# Patient Record
Sex: Female | Born: 1970
Health system: Southern US, Community
[De-identification: ages and names within clinical notes are randomized; demographics above are authoritative.]

## PROBLEM LIST (undated history)

## (undated) DIAGNOSIS — K219 Gastro-esophageal reflux disease without esophagitis: Secondary | ICD-10-CM

## (undated) DIAGNOSIS — I10 Essential (primary) hypertension: Secondary | ICD-10-CM

## (undated) HISTORY — DX: Gastro-esophageal reflux disease without esophagitis: K21.9

## (undated) HISTORY — DX: Essential (primary) hypertension: I10

---

## 2006-05-14 HISTORY — PX: ABDOMINAL HYSTERECTOMY: SHX81

## 2011-06-12 ENCOUNTER — Inpatient Hospital Stay (INDEPENDENT_AMBULATORY_CARE_PROVIDER_SITE_OTHER)
Admission: RE | Admit: 2011-06-12 | Discharge: 2011-06-12 | Disposition: A | Discharge status: Home / Self Care | Payer: BC Managed Care – PPO | Source: Ambulatory Visit | Attending: Emergency Medicine | Admitting: Emergency Medicine

## 2011-06-12 DIAGNOSIS — R197 Diarrhea, unspecified: Secondary | ICD-10-CM

## 2011-08-07 ENCOUNTER — Other Ambulatory Visit: Payer: Self-pay | Admitting: *Deleted

## 2011-08-07 DIAGNOSIS — Z1231 Encounter for screening mammogram for malignant neoplasm of breast: Secondary | ICD-10-CM

## 2011-08-17 ENCOUNTER — Emergency Department (HOSPITAL_COMMUNITY)
Admission: EM | Admit: 2011-08-17 | Discharge: 2011-08-18 | Disposition: A | Discharge status: Home / Self Care | Payer: BC Managed Care – PPO | Attending: Emergency Medicine | Admitting: Emergency Medicine

## 2011-08-17 DIAGNOSIS — M542 Cervicalgia: Secondary | ICD-10-CM | POA: Insufficient documentation

## 2011-08-17 DIAGNOSIS — I10 Essential (primary) hypertension: Secondary | ICD-10-CM | POA: Insufficient documentation

## 2011-08-17 DIAGNOSIS — M436 Torticollis: Secondary | ICD-10-CM | POA: Insufficient documentation

## 2011-08-17 HISTORY — DX: Essential (primary) hypertension: I10

## 2011-08-18 ENCOUNTER — Encounter: Payer: Self-pay | Admitting: *Deleted

## 2011-08-18 MED ORDER — DIAZEPAM 5 MG PO TABS: 5.0000 mg | ORAL_TABLET | Freq: Two times a day (BID) | ORAL | Status: AC

## 2011-08-18 MED ORDER — IBUPROFEN 200 MG PO TABS
600.0000 mg | ORAL_TABLET | Freq: Once | ORAL | Status: AC
  Administered 2011-08-18: 600 mg via ORAL
  Filled 2011-08-18: qty 3

## 2011-08-18 NOTE — ED Notes (Signed)
The pt is c/o a stiff neck for 4 days .  No known injury

## 2011-08-18 NOTE — ED Provider Notes (Addendum)
History     CSN: [id number] Arrival date & time: 08/17/2011 11:51 PM   First MD Initiated Contact with Patient 08/18/11 0140      Chief Complaint  Patient presents with  . Torticollis    (Consider location/radiation/quality/duration/timing/severity/associated sxs/prior treatment) HPI Comments: 4 days of L neck pain and stiffness ice injury.  States woke up one morning and neck was stiff to grip progressively gotten worse since.  Has tried over-the-counter Tylenol, Motrin, ice, heat, without relief  The history is provided by the patient.    Past Medical History  Diagnosis Date  . Hypertension     History reviewed. No pertinent past surgical history.  History reviewed. No pertinent family history.  History  Substance Use Topics  . Smoking status: Never Smoker   . Smokeless tobacco: Not on file  . Alcohol Use: No    OB History    Grav Para Term Preterm Abortions TAB SAB Ect Mult Living                  Review of Systems  Constitutional: Negative.   HENT: Positive for neck pain. Negative for ear pain, facial swelling, rhinorrhea, neck stiffness, tinnitus and ear discharge.   Eyes: Negative.   Cardiovascular: Negative.   Gastrointestinal: Negative.   Genitourinary: Negative.   Neurological: Negative.   Hematological: Negative.   Psychiatric/Behavioral: Negative.     Allergies  Diovan hct  Home Medications   Current Outpatient Rx  Name Route Sig Dispense Refill  . CARVEDILOL 25 MG PO TABS Oral Take 12.5-25 mg by mouth 2 (two) times daily with a meal. Takes 2 tablets in the morning and 1 tablet in the evening     . TYLENOL PM EXTRA STRENGTH PO Oral Take 2 tablets by mouth at bedtime as needed. For pain     . HYDROCHLOROTHIAZIDE 12.5 MG PO CAPS Oral Take 12.5 mg by mouth daily.      . ANALGESIC BALM 15-15 % EX OINT Topical Apply 1 application topically daily as needed. Muscle pain     . VITAMIN D (CHOLECALCIFEROL) PO Oral Take 1 tablet by mouth daily.         BP 141/76  Pulse 81  Temp 98.1 F (36.7 C)  Resp 19  SpO2 100%  Physical Exam  Nursing note and vitals reviewed. Constitutional: She is oriented to person, place, and time. She appears well-developed and well-nourished.  HENT:  Head: Atraumatic.  Eyes: EOM are normal.  Neck: No JVD present. No tracheal deviation present. No thyromegaly present.    Cardiovascular: Regular rhythm.   Pulmonary/Chest: Breath sounds normal.  Abdominal: Soft.  Lymphadenopathy:    She has no cervical adenopathy.  Neurological: She is oriented to person, place, and time.  Skin: Skin is warm and dry.  Psychiatric: She has a normal mood and affect.    ED Course  Procedures (including critical care time)  Labs Reviewed - No data to display No results found.   1. Torticollis       MDM  Muscle strain, meningitis,         Arman Filter, NP 08/18/11 [id number]  Arman Filter, NP 08/22/11 1191  Arman Filter, NP 09/09/11 0434  Arman Filter, NP 09/09/11 [phone]

## 2011-08-18 NOTE — ED Notes (Signed)
Pt c/o neck feeling stiff for approx. 4 days, no known injury.

## 2011-08-22 NOTE — ED Provider Notes (Signed)
Medical screening examination/treatment/procedure(s) were performed by non-physician practitioner and as supervising physician I was immediately available for consultation/collaboration.   Vida Roller, MD 08/22/11 [phone]

## 2011-09-09 NOTE — ED Provider Notes (Signed)
Medical screening examination/treatment/procedure(s) were performed by non-physician practitioner and as supervising physician I was immediately available for consultation/collaboration.   Vida Roller, MD 09/09/11 [phone]

## 2011-09-10 ENCOUNTER — Ambulatory Visit: Payer: BC Managed Care – PPO

## 2011-12-19 ENCOUNTER — Encounter: Payer: Self-pay | Admitting: Internal Medicine

## 2011-12-19 DIAGNOSIS — I10 Essential (primary) hypertension: Secondary | ICD-10-CM | POA: Insufficient documentation

## 2011-12-19 HISTORY — DX: Essential (primary) hypertension: I10

## 2011-12-24 ENCOUNTER — Ambulatory Visit: Payer: BC Managed Care – PPO | Admitting: Internal Medicine

## 2012-01-06 ENCOUNTER — Emergency Department (HOSPITAL_COMMUNITY)
Admission: EM | Admit: 2012-01-06 | Discharge: 2012-01-06 | Disposition: A | Discharge status: Home / Self Care | Payer: BC Managed Care – PPO | Attending: Emergency Medicine | Admitting: Emergency Medicine

## 2012-01-06 ENCOUNTER — Encounter (HOSPITAL_COMMUNITY): Payer: Self-pay | Admitting: *Deleted

## 2012-01-06 DIAGNOSIS — I10 Essential (primary) hypertension: Secondary | ICD-10-CM | POA: Insufficient documentation

## 2012-01-06 DIAGNOSIS — R319 Hematuria, unspecified: Secondary | ICD-10-CM | POA: Insufficient documentation

## 2012-01-06 DIAGNOSIS — N390 Urinary tract infection, site not specified: Secondary | ICD-10-CM

## 2012-01-06 LAB — URINALYSIS, ROUTINE W REFLEX MICROSCOPIC
Bilirubin Urine: NEGATIVE
Leukocytes, UA: NEGATIVE
Nitrite: NEGATIVE
Specific Gravity, Urine: 1.026 (ref 1.005–1.030)
pH: 6 (ref 5.0–8.0)

## 2012-01-06 LAB — URINE MICROSCOPIC-ADD ON

## 2012-01-06 MED ORDER — NITROFURANTOIN MONOHYD MACRO 100 MG PO CAPS: 100.0000 mg | ORAL_CAPSULE | Freq: Two times a day (BID) | ORAL | Status: AC

## 2012-01-06 MED ORDER — NITROFURANTOIN MONOHYD MACRO 100 MG PO CAPS
100.0000 mg | ORAL_CAPSULE | ORAL | Status: AC
  Administered 2012-01-06: 100 mg via ORAL
  Filled 2012-01-06: qty 1

## 2012-01-06 NOTE — ED Notes (Signed)
Discharged with written and verbal instructions.

## 2012-01-06 NOTE — Discharge Instructions (Signed)
Urinary Tract Infection A urinary tract infection (UTI) is often caused by a germ (bacteria). A UTI is usually helped with medicine (antibiotics) that kills germs. Take all the medicine until it is gone. Do this even if you are feeling better. You are usually better in 7 to 10 days. HOME CARE   Drink enough water and fluids to keep your pee (urine) clear or pale yellow. Drink:   Cranberry juice.   Water.   Avoid:   Caffeine.   Tea.   Bubbly (carbonated) drinks.   Alcohol.   Only take medicine as told by your doctor.   To prevent further infections:   Pee often.   After pooping (bowel movement), women should wipe from front to back. Use each tissue only once.   Pee before and after having sex (intercourse).  Ask your doctor when your test results will be ready. Make sure you follow up and get your test results.  GET HELP RIGHT AWAY IF:   There is very bad back pain or lower belly (abdominal) pain.   You get the chills.   You have a fever.   Your baby is older than 3 months with a rectal temperature of 102 F (38.9 C) or higher.   Your baby is 3 months old or younger with a rectal temperature of 100.4 F (38 C) or higher.   You feel sick to your stomach (nauseous) or throw up (vomit).   There is continued burning with peeing.   Your problems are not better in 3 days. Return sooner if you are getting worse.  MAKE SURE YOU:   Understand these instructions.   Will watch your condition.   Will get help right away if you are not doing well or get worse.  Document Released: 03/02/2008 Document Revised: 09/03/2011 Document Reviewed: 03/02/2008 [employer] Patient Information 2012 [employer]. 

## 2012-01-06 NOTE — ED Notes (Signed)
Med not given - sent to pharmacy.  Medications checked and returned from pharmacy for administration

## 2012-01-06 NOTE — ED Provider Notes (Signed)
History     CSN: [id number]  Arrival date & time 01/06/12  1958   First MD Initiated Contact with Patient 01/06/12 2155      Chief Complaint  Patient presents with  . Hematuria    (Consider location/radiation/quality/duration/timing/severity/associated sxs/prior treatment) Patient is a 41 y.o. female presenting with hematuria. The history is provided by the patient.  Hematuria This is a new problem. Episode onset: 2 days ago. The problem has been gradually worsening since onset. She describes the hematuria as gross hematuria. The hematuria occurs throughout her entire urinary stream. She reports no clotting in her urine stream. Her pain is at a severity of 4/10. The pain is moderate. She describes her urine color as tea colored. Irritative symptoms include frequency and urgency. Associated symptoms include chills, dysuria, flank pain and nausea. Pertinent negatives include no abdominal pain, fever or vomiting. She is sexually active. There is no history of kidney stones, recent infection or STDs.    Past Medical History  Diagnosis Date  . Hypertension   . HTN (hypertension) 12/19/2011    History reviewed. No pertinent past surgical history.  History reviewed. No pertinent family history.  History  Substance Use Topics  . Smoking status: Never Smoker   . Smokeless tobacco: Not on file  . Alcohol Use: No    OB History    Grav Para Term Preterm Abortions TAB SAB Ect Mult Living                  Review of Systems  Constitutional: Positive for chills. Negative for fever.  Gastrointestinal: Positive for nausea. Negative for vomiting and abdominal pain.  Genitourinary: Positive for dysuria, urgency, frequency, hematuria and flank pain.  All other systems reviewed and are negative.    Allergies  Diovan hct  Home Medications   Current Outpatient Rx  Name Route Sig Dispense Refill  . CARVEDILOL 25 MG PO TABS Oral Take 12.5-25 mg by mouth 2 (two) times daily with a meal.  Takes 2 tablets in the morning and 1 tablet in the evening     . HYDROCHLOROTHIAZIDE 12.5 MG PO CAPS Oral Take 12.5 mg by mouth daily.        BP 161/87  Pulse 74  Temp(Src) 98.6 F (37 C) (Oral)  Resp 20  SpO2 97%  Physical Exam  Nursing note and vitals reviewed. Constitutional: She is oriented to person, place, and time. She appears well-developed and well-nourished. No distress.  HENT:  Head: Normocephalic and atraumatic.  Eyes: EOM are normal. Pupils are equal, round, and reactive to light.  Cardiovascular: Normal rate, regular rhythm, normal heart sounds and intact distal pulses.  Exam reveals no friction rub.   No murmur heard. Pulmonary/Chest: Effort normal and breath sounds normal. She has no wheezes. She has no rales.  Abdominal: Soft. Bowel sounds are normal. She exhibits no distension. There is tenderness in the suprapubic area. There is no rebound, no guarding and no CVA tenderness.  Musculoskeletal: Normal range of motion. She exhibits no tenderness.       No edema  Neurological: She is alert and oriented to person, place, and time. No cranial nerve deficit.  Skin: Skin is warm and dry. No rash noted.  Psychiatric: She has a normal mood and affect. Her behavior is normal.    ED Course  Procedures (including critical care time)  Labs Reviewed  URINALYSIS, ROUTINE W REFLEX MICROSCOPIC - Abnormal; Notable for the following:    APPearance CLOUDY (*)  Hgb urine dipstick TRACE (*)    All other components within normal limits  URINE MICROSCOPIC-ADD ON - Abnormal; Notable for the following:    Squamous Epithelial / LPF MANY (*)    Bacteria, UA FEW (*)    All other components within normal limits   No results found.   No diagnosis found.    MDM   Patient with symptoms concerning for UTI. She complains of frequency, urgency, flank pain, nausea without vomiting or fever. She states that her symptoms started yesterday and only worsened. She denies any risk factors  for STD and states she has been actually active with one partner who is her husband for years and is not concerned. She denies any vaginal discharge. She has no prior history of kidney stones and her history today is not suggestive of that. UA shows few bacteria and red blood cells. Given her symptoms we'll treat with antibiotics and have her followup with her doctor if symptoms worsen.        Gwyneth Sprout, MD 01/06/12 2229

## 2012-01-06 NOTE — ED Notes (Signed)
Pt reports blood in urine and bilateral lower back pain since yesterday.  Pt reports frequency, urgency.  Denies burning with urination.

## 2012-05-11 ENCOUNTER — Ambulatory Visit: Payer: BC Managed Care – PPO | Admitting: Internal Medicine

## 2012-05-11 DIAGNOSIS — Z0289 Encounter for other administrative examinations: Secondary | ICD-10-CM

## 2012-09-26 ENCOUNTER — Emergency Department (HOSPITAL_COMMUNITY)
Admission: EM | Admit: 2012-09-26 | Discharge: 2012-09-26 | Disposition: A | Discharge status: Home / Self Care | Payer: Self-pay

## 2012-09-28 ENCOUNTER — Encounter (HOSPITAL_COMMUNITY): Payer: Self-pay | Admitting: Emergency Medicine

## 2012-09-28 ENCOUNTER — Emergency Department (HOSPITAL_COMMUNITY)
Admission: EM | Admit: 2012-09-28 | Discharge: 2012-09-28 | Disposition: A | Discharge status: Home / Self Care | Payer: BC Managed Care – PPO | Attending: Emergency Medicine | Admitting: Emergency Medicine

## 2012-09-28 ENCOUNTER — Emergency Department (HOSPITAL_COMMUNITY): Payer: BC Managed Care – PPO

## 2012-09-28 DIAGNOSIS — N390 Urinary tract infection, site not specified: Secondary | ICD-10-CM

## 2012-09-28 DIAGNOSIS — Z91199 Patient's noncompliance with other medical treatment and regimen due to unspecified reason: Secondary | ICD-10-CM | POA: Insufficient documentation

## 2012-09-28 DIAGNOSIS — Z9119 Patient's noncompliance with other medical treatment and regimen: Secondary | ICD-10-CM | POA: Insufficient documentation

## 2012-09-28 DIAGNOSIS — R079 Chest pain, unspecified: Secondary | ICD-10-CM

## 2012-09-28 DIAGNOSIS — I10 Essential (primary) hypertension: Secondary | ICD-10-CM

## 2012-09-28 DIAGNOSIS — Z3202 Encounter for pregnancy test, result negative: Secondary | ICD-10-CM | POA: Insufficient documentation

## 2012-09-28 DIAGNOSIS — R51 Headache: Secondary | ICD-10-CM | POA: Insufficient documentation

## 2012-09-28 LAB — URINALYSIS, ROUTINE W REFLEX MICROSCOPIC
Bilirubin Urine: NEGATIVE
Hgb urine dipstick: NEGATIVE
Protein, ur: NEGATIVE mg/dL
Urobilinogen, UA: 0.2 mg/dL (ref 0.0–1.0)

## 2012-09-28 LAB — BASIC METABOLIC PANEL
BUN: 6 mg/dL (ref 6–23)
Calcium: 9.3 mg/dL (ref 8.4–10.5)
GFR calc non Af Amer: >90 mL/min (ref >90)
Glucose, Bld: 90 mg/dL (ref 70–99)
Sodium: 134 mEq/L — ABNORMAL LOW (ref 135–145)

## 2012-09-28 LAB — URINE MICROSCOPIC-ADD ON

## 2012-09-28 LAB — POCT I-STAT TROPONIN I: Troponin i, poc: 0 ng/mL (ref 0.00–0.08)

## 2012-09-28 LAB — CBC
MCH: 26.6 pg (ref 26.0–34.0)
MCHC: 33.1 g/dL (ref 30.0–36.0)
Platelets: 393 10*3/uL (ref 150–400)
RDW: 12.8 % (ref 11.5–15.5)

## 2012-09-28 MED ORDER — CIPROFLOXACIN HCL 500 MG PO TABS: 500.0000 mg | ORAL_TABLET | Freq: Two times a day (BID) | ORAL | Status: DC

## 2012-09-28 MED ORDER — CIPROFLOXACIN HCL 500 MG PO TABS: 500.0000 mg | ORAL_TABLET | Freq: Once | ORAL | Status: DC

## 2012-09-28 MED ORDER — HYDROCHLOROTHIAZIDE 25 MG PO TABS: 25.0000 mg | ORAL_TABLET | Freq: Every day | ORAL | Status: DC

## 2012-09-28 MED ORDER — CIPROFLOXACIN HCL 500 MG PO TABS
ORAL_TABLET | ORAL | Status: AC
  Filled 2012-09-28: qty 1

## 2012-09-28 MED ORDER — CARVEDILOL 25 MG PO TABS: 25.0000 mg | ORAL_TABLET | Freq: Two times a day (BID) | ORAL | Status: DC

## 2012-09-28 MED ORDER — ASPIRIN 325 MG PO TABS
325.0000 mg | ORAL_TABLET | Freq: Once | ORAL | Status: AC
  Administered 2012-09-28: 325 mg via ORAL
  Filled 2012-09-28: qty 1

## 2012-09-28 MED ORDER — HYDROCHLOROTHIAZIDE 12.5 MG PO CAPS
12.5000 mg | ORAL_CAPSULE | Freq: Once | ORAL | Status: AC
Start: 2012-09-28 — End: 2012-09-28
  Administered 2012-09-28: 12.5 mg via ORAL
  Filled 2012-09-28: qty 1

## 2012-09-28 MED ORDER — CARVEDILOL 25 MG PO TABS
25.0000 mg | ORAL_TABLET | Freq: Once | ORAL | Status: AC
  Administered 2012-09-28: 25 mg via ORAL
  Filled 2012-09-28: qty 1

## 2012-09-28 NOTE — ED Notes (Signed)
Pt waiting on last lab result, in no acute distress

## 2012-09-28 NOTE — ED Notes (Signed)
Pt states that her blood pressure has been elevated and she's been out of her meds, she complains of central chest pain, dizziness, lightheadedness, and arm numbness for two days

## 2012-09-28 NOTE — ED Provider Notes (Signed)
History     CSN: [id number]  Arrival date & time 09/28/12  1413   First MD Initiated Contact with Patient 09/28/12 1510      Chief Complaint  Patient presents with  . Chest Pain    (Consider location/radiation/quality/duration/timing/severity/associated sxs/prior treatment) The history is provided by the patient.  Loretta Greene is a 42 y.o. female history of hypertension with medication noncompliance here with chest pain and headaches. She has not been taking her hydrochlorothiazide and Coreg for the last 3 months. In the last 3 days she's been having heaviness in her substernal chest area. It's constant and without any radiation. No nausea vomiting or abdominal pain and diarrhea. No history of CAD or stents. She also has intermittent headaches that is improving with Aleve. She measured her BP at home and was 180/90.    Past Medical History  Diagnosis Date  . Hypertension   . HTN (hypertension) 12/19/2011    History reviewed. No pertinent past surgical history.  History reviewed. No pertinent family history.  History  Substance Use Topics  . Smoking status: Never Smoker   . Smokeless tobacco: Not on file  . Alcohol Use: No    OB History    Grav Para Term Preterm Abortions TAB SAB Ect Mult Living                  Review of Systems  Cardiovascular: Positive for chest pain.  Neurological: Positive for headaches.  All other systems reviewed and are negative.    Allergies  Diovan hct  Home Medications   Current Outpatient Rx  Name  Route  Sig  Dispense  Refill  . NAPROXEN SODIUM 220 MG PO TABS   Oral   Take 220 mg by mouth 2 (two) times daily with a meal. PAIN           BP 135/81  Pulse 73  Temp 98.1 F (36.7 C) (Oral)  Resp 15  SpO2 100%  Physical Exam  Nursing note and vitals reviewed. Constitutional: She is oriented to person, place, and time. She appears well-developed and well-nourished. No distress.  HENT:  Head: Normocephalic.  Mouth/Throat:  Oropharynx is clear and moist.  Eyes: Conjunctivae normal are normal. Pupils are equal, round, and reactive to light.  Neck: Normal range of motion. Neck supple.  Cardiovascular: Normal rate, regular rhythm and normal heart sounds.   Pulmonary/Chest: Effort normal and breath sounds normal. No respiratory distress. She has no wheezes. She has no rales.  Abdominal: Soft. Bowel sounds are normal. She exhibits no distension. There is no tenderness. There is no rebound.  Musculoskeletal: Normal range of motion. She exhibits no edema and no tenderness.  Neurological: She is alert and oriented to person, place, and time. No cranial nerve deficit. Coordination normal.  Skin: Skin is warm and dry. She is not diaphoretic.  Psychiatric: She has a normal mood and affect. Her behavior is normal. Judgment and thought content normal.    ED Course  Procedures (including critical care time)  Labs Reviewed  BASIC METABOLIC PANEL - Abnormal; Notable for the following:    Sodium 134 (*)     All other components within normal limits  CBC - Abnormal; Notable for the following:    Hemoglobin 11.5 (*)     HCT 34.7 (*)     All other components within normal limits  URINALYSIS, ROUTINE W REFLEX MICROSCOPIC - Abnormal; Notable for the following:    Leukocytes, UA TRACE (*)  All other components within normal limits  POCT I-STAT TROPONIN I  PREGNANCY, URINE  URINE MICROSCOPIC-ADD ON  TROPONIN I   Dg Chest 2 View  09/28/2012  *RADIOLOGY REPORT*  Clinical Data: Chest pains  CHEST - 2 VIEW  Comparison: None  Findings: The heart size and mediastinal contours are within normal limits.  Both lungs are clear.  The visualized skeletal structures are unremarkable.  IMPRESSION: No active cardiopulmonary abnormalities.   Original Report Authenticated By: Signa Kell, M.D.      No diagnosis found.   Date: 09/28/2012  Rate: 73  Rhythm: normal sinus rhythm  QRS Axis: normal  Intervals: normal  ST/T Wave  abnormalities: normal  Conduction Disutrbances:none  Narrative Interpretation:   Old EKG Reviewed: none available     MDM  Loretta Greene is a 42 y.o. female here with HTN, chest pain, HA. Symptoms likely from uncontrolled hypertension. She has nonfocal neuro exam. Will get trop x 2 , labs, CXR. Will give her PO BP meds and reassess.   6:59 PM Trop neg x 2. UA +leuks, given cipro. BP normalized after PO meds. Will d/c home with hctz, coreg, cipro.        Richardean Canal, MD 09/28/12 1900

## 2012-09-28 NOTE — ED Notes (Signed)
Pt reports heaviness in chest for several days, denies SOB, nausea, radiation of pain or additional sx.

## 2012-10-15 ENCOUNTER — Emergency Department (HOSPITAL_COMMUNITY)
Admission: EM | Admit: 2012-10-15 | Discharge: 2012-10-15 | Disposition: A | Discharge status: Home / Self Care | Payer: BC Managed Care – PPO | Attending: Emergency Medicine | Admitting: Emergency Medicine

## 2012-10-15 ENCOUNTER — Encounter (HOSPITAL_COMMUNITY): Payer: Self-pay | Admitting: Emergency Medicine

## 2012-10-15 DIAGNOSIS — R197 Diarrhea, unspecified: Secondary | ICD-10-CM

## 2012-10-15 DIAGNOSIS — Z79899 Other long term (current) drug therapy: Secondary | ICD-10-CM | POA: Insufficient documentation

## 2012-10-15 DIAGNOSIS — I10 Essential (primary) hypertension: Secondary | ICD-10-CM | POA: Insufficient documentation

## 2012-10-15 NOTE — ED Notes (Signed)
Patient states that she had chinese food last night and since has no tbeen able to eat. The patient reports that she is having frequently loose stools. She is drinking "but it is going right through me"

## 2012-10-15 NOTE — ED Provider Notes (Signed)
History  Scribed for [name], PA-C/ Juliet Rude. Pickering, MD, the patient was seen in room WTR9/WTR9. This chart was scribed by Candelaria Stagers. The patient's care started at 7:39 PM   CSN: [id number]  Arrival date & time 10/15/12  1722   First MD Initiated Contact with Patient 10/15/12 1928      Chief Complaint  Patient presents with  . Diarrhea     The history is provided by the patient. No language interpreter was used.   Loretta Greene is a 42 y.o. female who presents to the Emergency Department complaining of diarrhea that started yesterday.  Pt ate chinese food yesterday and think the diarrhea is from this.  Pt is also experiencing diaphoresis right before diarrhea.  She denies abdominal pain or cramping, or fever.  She reports her last episode of diarrhea was about four hours ago.  Nothing seem to make the sx better or worse.  Pt reports she has the next three days off work.  Pt does not currently have a PCP.   Past Medical History  Diagnosis Date  . Hypertension   . HTN (hypertension) 12/19/2011    Past Surgical History  Procedure Date  . Abdominal hysterectomy     History reviewed. No pertinent family history.  History  Substance Use Topics  . Smoking status: Never Smoker   . Smokeless tobacco: Not on file  . Alcohol Use: No    OB History    Grav Para Term Preterm Abortions TAB SAB Ect Mult Living                  Review of Systems  Constitutional: Negative for fever, chills, diaphoresis and fatigue.  Respiratory: Negative for shortness of breath.   Gastrointestinal: Positive for diarrhea. Negative for nausea, vomiting and abdominal pain.  Neurological: Negative for weakness.  All other systems reviewed and are negative.    Allergies  Diovan hct  Home Medications   Current Outpatient Rx  Name  Route  Sig  Dispense  Refill  . CARVEDILOL 25 MG PO TABS   Oral   Take 1 tablet (25 mg total) by mouth 2 (two) times daily with a meal.   30 tablet   0   . HYDROCHLOROTHIAZIDE 25 MG PO TABS   Oral   Take 1 tablet (25 mg total) by mouth daily.   30 tablet   0   . NAPROXEN SODIUM 220 MG PO TABS   Oral   Take 220 mg by mouth 2 (two) times daily as needed. PAIN           BP 142/84  Pulse 73  Temp 98.6 F (37 C) (Oral)  Resp 18  SpO2 100%  Physical Exam  Nursing note and vitals reviewed. Constitutional: She is oriented to person, place, and time. She appears well-developed and well-nourished. No distress.  HENT:  Head: Normocephalic and atraumatic.  Eyes: EOM are normal.  Neck: Neck supple. No tracheal deviation present.  Cardiovascular: Normal rate.   Pulmonary/Chest: Effort normal. No respiratory distress.  Abdominal: Soft. Bowel sounds are normal. She exhibits no distension. There is no tenderness. There is no guarding.  Musculoskeletal: Normal range of motion.  Neurological: She is alert and oriented to person, place, and time.  Skin: Skin is warm and dry.  Psychiatric: She has a normal mood and affect. Her behavior is normal.    ED Course  Procedures   DIAGNOSTIC STUDIES: Oxygen Saturation is 100% on room air, normal by my  interpretation.    COORDINATION OF CARE:  7:42 PM  Advised pt to continue drinking fluids over the next few days.  Discussed with pt what sx should bring her back to the ED including fever, abdominal pain, or persistent diarrhea for more than three days.  Pt understands and agrees.  Pt decline blood work.    Labs Reviewed - No data to display No results found.   1. Diarrhea       MDM  Pt has been advised of the symptoms that warrant their return to the ED. Patient has voiced understanding and has agreed to follow-up with the PCP or specialist.  I personally performed the services described in this documentation, which was scribed in my presence. The recorded information has been reviewed and is accurate.        Dorthula Matas, PA 10/15/12 2223

## 2012-10-15 NOTE — ED Provider Notes (Signed)
Medical screening examination/treatment/procedure(s) were performed by non-physician practitioner and as supervising physician I was immediately available for consultation/collaboration.  Kyah Buesing R. Shakur Lembo, MD 10/15/12 2314 

## 2012-11-22 ENCOUNTER — Ambulatory Visit (INDEPENDENT_AMBULATORY_CARE_PROVIDER_SITE_OTHER): Payer: BC Managed Care – PPO | Admitting: Physician Assistant

## 2012-11-22 ENCOUNTER — Encounter: Payer: Self-pay | Admitting: Physician Assistant

## 2012-11-22 VITALS — BP 160/92 | HR 65 | Temp 98.2°F | Resp 16 | Ht 60.0 in | Wt 169.0 lb

## 2012-11-22 DIAGNOSIS — I10 Essential (primary) hypertension: Secondary | ICD-10-CM

## 2012-11-22 DIAGNOSIS — Z23 Encounter for immunization: Secondary | ICD-10-CM

## 2012-11-22 DIAGNOSIS — K219 Gastro-esophageal reflux disease without esophagitis: Secondary | ICD-10-CM

## 2012-11-22 MED ORDER — CARVEDILOL 25 MG PO TABS: 25.0000 mg | ORAL_TABLET | Freq: Two times a day (BID) | ORAL | Status: DC

## 2012-11-22 MED ORDER — HYDROCHLOROTHIAZIDE 25 MG PO TABS: 25.0000 mg | ORAL_TABLET | Freq: Every day | ORAL | Status: DC

## 2012-11-22 MED ORDER — OMEPRAZOLE 20 MG PO CPDR: 20.0000 mg | DELAYED_RELEASE_CAPSULE | Freq: Every day | ORAL | Status: DC

## 2012-11-22 MED ORDER — CARVEDILOL 25 MG PO TABS
25.0000 mg | ORAL_TABLET | Freq: Two times a day (BID) | ORAL | Status: DC
Start: 2012-11-22 — End: 2012-11-22

## 2012-11-22 NOTE — Progress Notes (Signed)
Subjective:    Patient ID: Loretta Greene, female    DOB: [birth year], 42 y.o.   MRN: [id number]  HPI  This 42 y.o. female presents for evaluation of HTN and to establish here for primary care.  She has run out of her medications x 3 weeks and needs a PCP to refill them.  Additionally, she reports a sensation of food getting "hung up" in her throat.  No choking.  No pain with swallowing.  Most often occurs with meats. Also notes heartburn with eating.  Was previously prescribed omeprazole, but has run out of that as well.   Past Medical History  Diagnosis Date  . Hypertension   . HTN (hypertension) 12/19/2011  . GERD (gastroesophageal reflux disease)     Past Surgical History  Procedure Laterality Date  . Abdominal hysterectomy  05/14/2006    uterine fibroids; ?bladder sling?    Prior to Admission medications   Medication Sig Start Date End Date Taking? Authorizing Provider  carvedilol (COREG) 25 MG tablet Take 1 tablet (25 mg total) by mouth 2 (two) times daily with a meal. 09/28/12  Yes Richardean Canal, MD  hydrochlorothiazide (HYDRODIURIL) 25 MG tablet Take 1 tablet (25 mg total) by mouth daily. 09/28/12  Yes Richardean Canal, MD  naproxen sodium (ANAPROX) 220 MG tablet Take 220 mg by mouth 2 (two) times daily as needed. PAIN    Historical Provider, MD    Allergies  Allergen Reactions  . Diovan Hct (Valsartan-Hydrochlorothiazide) Other (See Comments)    Tongue swells    History   Social History  . Marital Status: Married    Spouse Name: Hama    Number of Children: 3  . Years of Education: 18   Occupational History  . Security Dance movement psychotherapist   Social History Main Topics  . Smoking status: Former Games developer  . Smokeless tobacco: Never Used  . Alcohol Use: No  . Drug Use: No  . Sexually Active: Yes -- Female partner(s)    Birth Control/ Protection: Surgical   Other Topics Concern  . Not on file   Social History Narrative   Lives with her husband.  Children are adults.   Has  Master's Degree in Theology/Biblical Studies    Family History  Problem Relation Age of Onset  . Diabetes Mother   . Hypertension Mother   . Hypertension Father   . Kidney disease Father   . Heart disease Father     Review of Systems As above.  No chest pain, SOB, HA, dizziness, vision change, N/V, diarrhea, constipation, dysuria, urinary urgency or frequency, myalgias, arthralgias or rash.     Objective:   Physical Exam  Blood pressure 160/92, pulse 65, temperature 98.2 F (36.8 C), temperature source Oral, resp. rate 16, height 5' (1.524 m), weight 169 lb (76.658 kg), SpO2 100.00%. Body mass index is 33.01 kg/(m^2). Well-developed, well nourished BF who is awake, alert and oriented, in NAD. HEENT: [place]/AT, PERRL, EOMI.  Sclera and conjunctiva are clear.  EAC are patent, TMs are normal in appearance. Nasal mucosa is pink and moist. OP is clear. Neck: supple, non-tender, no lymphadenopathy, thyromegaly. Heart: RRR, no murmur Lungs: normal effort, CTA Extremities: no cyanosis, clubbing or edema. Skin: warm and dry without rash. Psychologic: good mood and appropriate affect, normal speech and behavior.     Assessment & Plan:  HTN (hypertension) - Plan: carvedilol (COREG) 25 MG tablet, hydrochlorothiazide (HYDRODIURIL) 25 MG tablet; re-check in 4-6 weeks back on meds.  Adjust as needed.  Globus sensation/GERD (gastroesophageal reflux disease) - Plan: omeprazole (PRILOSEC) 20 MG capsule  Need for prophylactic vaccination and inoculation against influenza - Plan: Flu vaccine greater than or equal to 3yo preservative free IM  RTC 4-6 weeks for CPE and fasting labs.

## 2012-11-25 ENCOUNTER — Telehealth: Payer: Self-pay

## 2012-11-25 ENCOUNTER — Telehealth: Payer: Self-pay | Admitting: Radiology

## 2012-11-25 NOTE — Telephone Encounter (Signed)
She states she will return to clinic for this illness.

## 2012-11-25 NOTE — Telephone Encounter (Signed)
She can not get the flu from the vaccine, not a live virus she should drink lots of fluids and take tylenol if she feels poorly. She is advised to call me back with questions.

## 2012-11-25 NOTE — Telephone Encounter (Signed)
PT STATES SHE CAME IN AND GOT A FLU SHOT, NOW IT SEEMS LIKE SHE HAVE THE FLU. WOULD LIKE TO KNOW WHAT CAN SHE TAKE PLEASE CALL [phone]    [facility] [address]

## 2012-11-27 ENCOUNTER — Ambulatory Visit (INDEPENDENT_AMBULATORY_CARE_PROVIDER_SITE_OTHER): Payer: BC Managed Care – PPO | Admitting: Emergency Medicine

## 2012-11-27 VITALS — BP 136/74 | HR 84 | Temp 98.2°F | Resp 18 | Ht 62.0 in | Wt 172.2 lb

## 2012-11-27 DIAGNOSIS — J00 Acute nasopharyngitis [common cold]: Secondary | ICD-10-CM

## 2012-11-27 DIAGNOSIS — IMO0001 Reserved for inherently not codable concepts without codable children: Secondary | ICD-10-CM

## 2012-11-27 MED ORDER — PSEUDOEPHEDRINE-GUAIFENESIN ER 60-600 MG PO TB12: 1.0000 | ORAL_TABLET | Freq: Two times a day (BID) | ORAL | Status: DC

## 2012-11-27 MED ORDER — HYDROCOD POLST-CHLORPHEN POLST 10-8 MG/5ML PO LQCR: 5.0000 mL | Freq: Two times a day (BID) | ORAL | Status: DC | PRN

## 2012-11-27 NOTE — Patient Instructions (Addendum)

## 2012-11-27 NOTE — Progress Notes (Signed)
[facility] [address] [place] [address] [phone]  Date:  11/27/2012   Name:  Loretta Greene   DOB:  [birth year]   MRN:  [id number]  PCP:  Edy Mcbane Not In System    Chief Complaint: Cough, Sore Throat and Otalgia   History of Present Illness:  Loretta Greene is a 42 y.o. very pleasant female patient who presents with the following:  Ill since Thursday with malaise, myalgias, and fatigue.  Had a fever past three days.  No nausea or vomiting. No stool change or rash.  Has a cough productive mucoid sputum.  Worse when supine.  No wheezing or shortness of breath.  Has nasal congestion and mucoid nasal drainage.  Now has post nasal drainage.  No improvement with over the counter medications or other home remedies.   Patient Active Problem List  Diagnosis  . HTN (hypertension)  . GERD (gastroesophageal reflux disease)    Past Medical History  Diagnosis Date  . Hypertension   . HTN (hypertension) 12/19/2011  . GERD (gastroesophageal reflux disease)     Past Surgical History  Procedure Laterality Date  . Abdominal hysterectomy  05/14/2006    uterine fibroids; ?bladder sling?    History  Substance Use Topics  . Smoking status: Former Games developer  . Smokeless tobacco: Never Used  . Alcohol Use: No    Family History  Problem Relation Age of Onset  . Diabetes Mother   . Hypertension Mother   . Hypertension Father   . Kidney disease Father   . Heart disease Father     Allergies  Allergen Reactions  . Diovan Hct (Valsartan-Hydrochlorothiazide) Other (See Comments)    Tongue swells    Medication list has been reviewed and updated.  Current Outpatient Prescriptions on File Prior to Visit  Medication Sig Dispense Refill  . carvedilol (COREG) 25 MG tablet Take 1 tablet (25 mg total) by mouth 2 (two) times daily with a meal.  30 tablet  3  . hydrochlorothiazide (HYDRODIURIL) 25 MG tablet Take 1 tablet (25 mg total) by mouth daily.  30 tablet  3  . omeprazole  (PRILOSEC) 20 MG capsule Take 1 capsule (20 mg total) by mouth daily.  30 capsule  3  . naproxen sodium (ANAPROX) 220 MG tablet Take 220 mg by mouth 2 (two) times daily as needed. PAIN       No current facility-administered medications on file prior to visit.    Review of Systems:  As per HPI, otherwise negative.    Physical Examination: Filed Vitals:   11/27/12 1610  BP: 136/74  Pulse: 84  Temp: 98.2 F (36.8 C)  Resp: 18   Filed Vitals:   11/27/12 1610  Height: 5\' 2"  (1.575 m)  Weight: 172 lb 3.2 oz (78.109 kg)   Body mass index is 31.49 kg/(m^2). Ideal Body Weight: Weight in (lb) to have BMI = 25: 136.4  GEN: WDWN, NAD, Non-toxic, A & O x 3 HEENT: Atraumatic, Normocephalic. Neck supple. No masses, No LAD. Ears and Nose: No external deformity. CV: RRR, No M/G/R. No JVD. No thrill. No extra heart sounds. PULM: CTA B, no wheezes, crackles, rhonchi. No retractions. No resp. distress. No accessory muscle use. ABD: S, NT, ND, +BS. No rebound. No HSM. EXTR: No c/c/e NEURO Normal gait.  PSYCH: Normally interactive. Conversant. Not depressed or anxious appearing.  Calm demeanor.    Assessment and Plan: COLD mucinex tussionex   Carmelina Dane, MD

## 2013-01-31 ENCOUNTER — Ambulatory Visit (INDEPENDENT_AMBULATORY_CARE_PROVIDER_SITE_OTHER): Payer: BC Managed Care – PPO | Admitting: Physician Assistant

## 2013-01-31 ENCOUNTER — Encounter: Payer: Self-pay | Admitting: Physician Assistant

## 2013-01-31 VITALS — BP 158/86 | HR 71 | Temp 98.5°F | Resp 16 | Ht 59.0 in | Wt 169.6 lb

## 2013-01-31 DIAGNOSIS — R51 Headache: Secondary | ICD-10-CM

## 2013-01-31 DIAGNOSIS — Z0000 Encounter for general adult medical examination without abnormal findings: Secondary | ICD-10-CM

## 2013-01-31 DIAGNOSIS — Z1211 Encounter for screening for malignant neoplasm of colon: Secondary | ICD-10-CM

## 2013-01-31 DIAGNOSIS — G4700 Insomnia, unspecified: Secondary | ICD-10-CM

## 2013-01-31 DIAGNOSIS — R634 Abnormal weight loss: Secondary | ICD-10-CM

## 2013-01-31 DIAGNOSIS — R110 Nausea: Secondary | ICD-10-CM

## 2013-01-31 DIAGNOSIS — Z1239 Encounter for other screening for malignant neoplasm of breast: Secondary | ICD-10-CM

## 2013-01-31 DIAGNOSIS — R350 Frequency of micturition: Secondary | ICD-10-CM

## 2013-01-31 DIAGNOSIS — Z1231 Encounter for screening mammogram for malignant neoplasm of breast: Secondary | ICD-10-CM

## 2013-01-31 DIAGNOSIS — I10 Essential (primary) hypertension: Secondary | ICD-10-CM

## 2013-01-31 DIAGNOSIS — Z23 Encounter for immunization: Secondary | ICD-10-CM

## 2013-01-31 LAB — IFOBT (OCCULT BLOOD): IFOBT: NEGATIVE

## 2013-01-31 LAB — COMPREHENSIVE METABOLIC PANEL
Albumin: 3.9 g/dL (ref 3.5–5.2)
Alkaline Phosphatase: 39 U/L (ref 39–117)
BUN: 8 mg/dL (ref 6–23)
CO2: 27 mEq/L (ref 19–32)
Calcium: 9 mg/dL (ref 8.4–10.5)
Chloride: 104 mEq/L (ref 96–112)
Glucose, Bld: 92 mg/dL (ref 70–99)
Potassium: 4.3 mEq/L (ref 3.5–5.3)
Sodium: 138 mEq/L (ref 135–145)
Total Protein: 6.9 g/dL (ref 6.0–8.3)

## 2013-01-31 LAB — POCT UA - MICROSCOPIC ONLY
Casts, Ur, LPF, POC: NEGATIVE
Crystals, Ur, HPF, POC: NEGATIVE

## 2013-01-31 LAB — POCT URINALYSIS DIPSTICK
Protein, UA: NEGATIVE
Spec Grav, UA: 1.02
Urobilinogen, UA: 0.2
pH, UA: 6

## 2013-01-31 LAB — LIPID PANEL
LDL Cholesterol: 123 mg/dL — ABNORMAL HIGH (ref 0–99)
Triglycerides: 95 mg/dL (ref <150)

## 2013-01-31 LAB — GLUCOSE, POCT (MANUAL RESULT ENTRY): POC Glucose: 98 mg/dl (ref 70–99)

## 2013-01-31 MED ORDER — CARVEDILOL 25 MG PO TABS: ORAL_TABLET | ORAL | Status: DC

## 2013-01-31 MED ORDER — CHLORTHALIDONE 25 MG PO TABS: 25.0000 mg | ORAL_TABLET | Freq: Every day | ORAL | Status: DC

## 2013-01-31 NOTE — Patient Instructions (Signed)

## 2013-01-31 NOTE — Progress Notes (Signed)
Subjective:    Patient ID: Loretta Greene, female    DOB: 08/30/1971, 42 y.o.   MRN: [id number]  HPI This 42 y.o. female presents for Annual Wellness Exam.  She notes that her most recent prescription for carvedilol was for 25 mg BID, when previous prescriptions had been for 50 mg QAM, 25 mg QPM.  She's been taking it the way she "always has," rather than following the most recent instructions.  Patient Active Problem List   Diagnosis Date Noted  . GERD (gastroesophageal reflux disease) 11/22/2012  . HTN (hypertension) 12/19/2011    Past Medical History  Diagnosis Date  . Hypertension   . HTN (hypertension) 12/19/2011  . GERD (gastroesophageal reflux disease)     Past Surgical History  Procedure Laterality Date  . Abdominal hysterectomy  05/14/2006    uterine fibroids; ?bladder sling?    Prior to Admission medications   Medication Sig Start Date End Date Taking? Authorizing Provider  carvedilol (COREG) 25 MG tablet Take 2 tabs (50 mg) PO QAM, and take 1 tab (25 mg) PO QPM 01/31/13  Yes Jesus Nevills S Claus Silvestro, PA-C  omeprazole (PRILOSEC) 20 MG capsule Take 1 capsule (20 mg total) by mouth daily. 11/22/12  Yes Zaya Kessenich S Genevia Bouldin, PA-C  chlorthalidone (HYGROTON) 25 MG tablet Take 1 tablet (25 mg total) by mouth daily. 01/31/13   [place] Tessa Lerner, PA-C    Allergies  Allergen Reactions  . Diovan Hct (Valsartan-Hydrochlorothiazide) Other (See Comments)    Tongue swells    History   Social History  . Marital Status: Married    Spouse Name: Hama    Number of Children: 3  . Years of Education: 18   Occupational History  . Security Dance movement psychotherapist   Social History Main Topics  . Smoking status: Former Games developer  . Smokeless tobacco: Never Used  . Alcohol Use: No  . Drug Use: No  . Sexually Active: Yes -- Female partner(s)    Birth Control/ Protection: Surgical   Other Topics Concern  . Not on file   Social History Narrative   Lives with her husband.  Children are adults.   Has  Master's Degree in Theology/Biblical Studies    Family History  Problem Relation Age of Onset  . Diabetes Mother   . Hypertension Mother   . Hypertension Father   . Kidney disease Father   . Heart disease Father        Review of Systems  Constitutional: Positive for unexpected weight change (weight loss). Negative for fever, chills, diaphoresis, activity change, appetite change and fatigue.  HENT: Negative for hearing loss, ear pain, nosebleeds, congestion, sore throat, facial swelling, rhinorrhea, sneezing, drooling, mouth sores, trouble swallowing, neck pain, neck stiffness, dental problem, voice change, postnasal drip, sinus pressure, tinnitus and ear discharge.   Eyes: Negative.   Respiratory: Negative.   Cardiovascular: Negative.   Gastrointestinal: Positive for nausea (mild x 2 weeks). Negative for vomiting, abdominal pain, diarrhea, constipation, blood in stool, abdominal distention, anal bleeding and rectal pain.  Endocrine: Positive for polyuria (x 2 weeks). Negative for cold intolerance, heat intolerance, polydipsia and polyphagia.  Musculoskeletal: Negative.   Skin: Negative.   Allergic/Immunologic: Negative.   Neurological: Positive for headaches (usually upon waking, x 2 months). Negative for dizziness, tremors, seizures, syncope, facial asymmetry, speech difficulty, weakness, light-headedness and numbness.  Hematological: Negative.   Psychiatric/Behavioral: Positive for sleep disturbance (x2 months, since her husband started working full-time nights.  She's getting 5 hours/night max. She  starts working nights next week.) and dysphoric mood (feeling tired and stressed. Working 7 days/week.). Negative for suicidal ideas, hallucinations, behavioral problems, confusion, self-injury, decreased concentration and agitation. The patient is not nervous/anxious and is not hyperactive.        Objective:   Physical Exam  Vitals reviewed. Constitutional: She is oriented to person,  place, and time. Vital signs are normal. She appears well-developed and well-nourished. She is active and cooperative. No distress.  HENT:  Head: Normocephalic and atraumatic.  Right Ear: Hearing, tympanic membrane, external ear and ear canal normal. No foreign bodies.  Left Ear: Hearing, tympanic membrane, external ear and ear canal normal. No foreign bodies.  Nose: Nose normal.  Mouth/Throat: Uvula is midline, oropharynx is clear and moist and mucous membranes are normal. No oral lesions. Normal dentition. No dental abscesses or edematous. No oropharyngeal exudate.  Eyes: Conjunctivae, EOM and lids are normal. Pupils are equal, round, and reactive to light. Right eye exhibits no discharge. Left eye exhibits no discharge. No scleral icterus.  Fundoscopic exam:      The right eye shows no arteriolar narrowing, no AV nicking, no exudate, no hemorrhage and no papilledema.       The left eye shows no arteriolar narrowing, no AV nicking, no exudate, no hemorrhage and no papilledema.  Neck: Trachea normal, normal range of motion and full passive range of motion without pain. Neck supple. No spinous process tenderness and no muscular tenderness present. No mass and no thyromegaly present.  Cardiovascular: Normal rate, regular rhythm, normal heart sounds, intact distal pulses and normal pulses.   Pulmonary/Chest: Effort normal and breath sounds normal. She exhibits no tenderness and no retraction. Right breast exhibits no inverted nipple, no mass, no nipple discharge, no skin change and no tenderness. Left breast exhibits no inverted nipple, no mass, no nipple discharge, no skin change and no tenderness. Breasts are symmetrical.  Abdominal: Soft. Normal appearance and bowel sounds are normal. She exhibits no distension and no mass. There is no hepatosplenomegaly. There is no tenderness. There is no rigidity, no rebound, no guarding, no CVA tenderness, no tenderness at McBurney's point and negative Murphy's  sign. No hernia. Hernia confirmed negative in the right inguinal area and confirmed negative in the left inguinal area.  Genitourinary: Rectum normal, vagina normal and uterus normal. Rectal exam shows no external hemorrhoid and no fissure. No breast swelling, tenderness, discharge or bleeding. Pelvic exam was performed with patient supine. No labial fusion. There is no rash, tenderness, lesion or injury on the right labia. There is no rash, tenderness, lesion or injury on the left labia. Cervix exhibits no motion tenderness, no discharge and no friability. Right adnexum displays no mass, no tenderness and no fullness. Left adnexum displays no mass, no tenderness and no fullness. No erythema, tenderness or bleeding around the vagina. No foreign body around the vagina. No signs of injury around the vagina. No vaginal discharge found.  Musculoskeletal: She exhibits no edema and no tenderness.       Cervical back: Normal.       Thoracic back: Normal.       Lumbar back: Normal.  Lymphadenopathy:       Head (right side): No tonsillar, no preauricular, no posterior auricular and no occipital adenopathy present.       Head (left side): No tonsillar, no preauricular, no posterior auricular and no occipital adenopathy present.    She has no cervical adenopathy.    She has no axillary adenopathy.  Right: No inguinal and no supraclavicular adenopathy present.       Left: No inguinal and no supraclavicular adenopathy present.  Neurological: She is alert and oriented to person, place, and time. She has normal strength and normal reflexes. No cranial nerve deficit. She exhibits normal muscle tone. Coordination and gait normal.  Skin: Skin is warm, dry and intact. No rash noted. She is not diaphoretic. No cyanosis or erythema. Nails show no clubbing.  Psychiatric: She has a normal mood and affect. Her speech is normal and behavior is normal. Judgment and thought content normal.   Results for orders placed in  visit on 01/31/13  GLUCOSE, POCT (MANUAL RESULT ENTRY)      Result Value Range   POC Glucose 98  70 - 99 mg/dl  IFOBT (OCCULT BLOOD)      Result Value Range   IFOBT Negative    POCT GLYCOSYLATED HEMOGLOBIN (HGB A1C)      Result Value Range   Hemoglobin A1C 5.7    POCT UA - MICROSCOPIC ONLY      Result Value Range   WBC, Ur, HPF, POC 2-3     RBC, urine, microscopic 1-2     Bacteria, U Microscopic 1+     Mucus, UA pos     Epithelial cells, urine per micros 2-3     Crystals, Ur, HPF, POC neg     Casts, Ur, LPF, POC neg     Yeast, UA neg    POCT URINALYSIS DIPSTICK      Result Value Range   Color, UA yellow     Clarity, UA clear     Glucose, UA neg     Bilirubin, UA neg     Ketones, UA neg     Spec Grav, UA 1.020     Blood, UA small     pH, UA 6.0     Protein, UA neg     Urobilinogen, UA 0.2     Nitrite, UA neg     Leukocytes, UA Negative         Assessment & Plan:  Routine general medical examination at a health care facility - Plan: Lipid panel, Pap IG (Image Guided); No additional paps needed.  Plan vaginal and bimanual exams annually. Age appropriate anticipatory guidance provided.  HTN (hypertension) - Plan: CBC with Differential, Comprehensive metabolic panel, TSH, carvedilol (COREG) 25 MG tablet (2 PO QAM, 1 PO QPM), STOP: HCTZ. START: chlorthalidone (HYGROTON) 25 MG tablet  Insomnia - Plan: TSH  Headache - likely due to lack of sleep.  If persists after shift change, re-evaluate.  Nausea alone - see HA above.  Urinary frequency - Plan: POCT glucose (manual entry), POCT glycosylated hemoglobin (Hb A1C), POCT UA - Microscopic Only, POCT urinalysis dipstick.  Patient left before results were available, and urine specimen discarded, so UCx not available.  No evidence of diabetes, and doubt UTI.  Re-evaluate if symptoms persist.  Screening for colon cancer - Plan: IFOBT POC (occult bld, rslt in office)  Loss of weight - Plan: TSH; encouraged healthy lifestyle  modifications.  Screening for breast cancer - Plan: MM Digital Screening  Fernande Bras, PA-C Physician Assistant-Certified [facility]

## 2013-02-01 LAB — CBC WITH DIFFERENTIAL/PLATELET
Basophils Relative: 1 % (ref 0–1)
HCT: 33.6 % — ABNORMAL LOW (ref 36.0–46.0)
Hemoglobin: 10.9 g/dL — ABNORMAL LOW (ref 12.0–15.0)
Lymphocytes Relative: 44 % (ref 12–46)
Lymphs Abs: 2.1 10*3/uL (ref 0.7–4.0)
Monocytes Absolute: 0.3 10*3/uL (ref 0.1–1.0)
Monocytes Relative: 6 % (ref 3–12)
Neutro Abs: 2.2 10*3/uL (ref 1.7–7.7)
Neutrophils Relative %: 45 % (ref 43–77)
RBC: 4.06 MIL/uL (ref 3.87–5.11)
WBC: 4.8 10*3/uL (ref 4.0–10.5)

## 2013-02-01 LAB — PAP IG (IMAGE GUIDED)

## 2013-02-07 ENCOUNTER — Encounter: Payer: Self-pay | Admitting: Physician Assistant

## 2013-03-14 ENCOUNTER — Ambulatory Visit: Payer: BC Managed Care – PPO

## 2013-03-16 ENCOUNTER — Ambulatory Visit
Admission: RE | Admit: 2013-03-16 | Discharge: 2013-03-16 | Disposition: A | Discharge status: Home / Self Care | Payer: BC Managed Care – HMO | Source: Ambulatory Visit | Attending: Physician Assistant | Admitting: Physician Assistant

## 2013-03-16 DIAGNOSIS — Z1231 Encounter for screening mammogram for malignant neoplasm of breast: Secondary | ICD-10-CM

## 2013-07-10 ENCOUNTER — Telehealth: Payer: Self-pay

## 2013-07-10 NOTE — Telephone Encounter (Addendum)
LM for CB-unable to reach priot to visit Patient had Gen Medical Exam with PAP 01/2013  Pharmacy updated, uses  for 90 day supply Pharmacy updated, uses  for local pharmacy  HM UTD: Yes Immunizations due: Flu vaccine   A/P:  Last:  PAP:         01/2013 WNL    MMG:  02/2013 WNL Dexa:  na CCS:  na DM:  na HTN:  due Lipids:  To Discuss with Provider:

## 2013-07-11 ENCOUNTER — Ambulatory Visit (INDEPENDENT_AMBULATORY_CARE_PROVIDER_SITE_OTHER): Payer: BC Managed Care – PPO | Admitting: Family Medicine

## 2013-07-11 ENCOUNTER — Encounter: Payer: Self-pay | Admitting: Family Medicine

## 2013-07-11 VITALS — BP 140/80 | HR 61 | Temp 98.2°F | Resp 16 | Ht 61.5 in | Wt 166.2 lb

## 2013-07-11 DIAGNOSIS — I10 Essential (primary) hypertension: Secondary | ICD-10-CM

## 2013-07-11 DIAGNOSIS — E785 Hyperlipidemia, unspecified: Secondary | ICD-10-CM

## 2013-07-11 DIAGNOSIS — Z01419 Encounter for gynecological examination (general) (routine) without abnormal findings: Secondary | ICD-10-CM

## 2013-07-11 DIAGNOSIS — D649 Anemia, unspecified: Secondary | ICD-10-CM

## 2013-07-11 LAB — CBC WITH DIFFERENTIAL/PLATELET
Basophils Absolute: 0 10*3/uL (ref 0.0–0.1)
Hemoglobin: 10.6 g/dL — ABNORMAL LOW (ref 12.0–15.0)
Lymphocytes Relative: 39.9 % (ref 12.0–46.0)
Lymphs Abs: 2.3 10*3/uL (ref 0.7–4.0)
Monocytes Relative: 6.3 % (ref 3.0–12.0)
Neutrophils Relative %: 47.3 % (ref 43.0–77.0)
Platelets: 328 10*3/uL (ref 150.0–400.0)
RDW: 13 % (ref 11.5–14.6)
WBC: 5.7 10*3/uL (ref 4.5–10.5)

## 2013-07-11 LAB — BASIC METABOLIC PANEL
CO2: 30 mEq/L (ref 19–32)
Calcium: 8.6 mg/dL (ref 8.4–10.5)
Chloride: 104 mEq/L (ref 96–112)
GFR: 148.42 mL/min (ref >60.00)
Potassium: 3 mEq/L — ABNORMAL LOW (ref 3.5–5.1)
Sodium: 140 mEq/L (ref 135–145)

## 2013-07-11 LAB — LIPID PANEL
HDL: 39.8 mg/dL (ref >39.00)
Total CHOL/HDL Ratio: 5
VLDL: 23.8 mg/dL (ref 0.0–40.0)

## 2013-07-11 LAB — HEPATIC FUNCTION PANEL
ALT: 16 U/L (ref 0–35)
Alkaline Phosphatase: 36 U/L — ABNORMAL LOW (ref 39–117)
Bilirubin, Direct: 0.1 mg/dL (ref 0.0–0.3)
Total Protein: 6.9 g/dL (ref 6.0–8.3)

## 2013-07-11 NOTE — Patient Instructions (Signed)
Schedule your complete physical for June 2015 We'll call you with your GYN referral We'll notify you of your lab results and make any changes if needed Keep up the good work!  You look great! Welcome!  We're glad to have you!

## 2013-07-11 NOTE — Progress Notes (Signed)
  Subjective:    Patient ID: Loretta Greene, female    DOB: [birth year], 42 y.o.   MRN: [id number]  HPI New to establish.  Previous MD- [place]  Pt interested in establishing w/ GYN- had normal pap and mammo earlier this year.  HTN- chronic problem, on carvedilol, chlorthalidone.  Not checking BP regularly.  No CP, SOB, HAs, visual changes, edema.  Has not taken meds yet today.  Hyperlipidemia- LDL in May was 123.  Not on meds.  Limited to no exercise.  Not following any particular diet- attempts to limit fried foods.  Mild anemia- pt had partial hysterectomy (ovaries remain) in 2007.  Was found to have mild anemia on last labs.  No longer having periods.  + fatigue.  Mom has hx of anemia- was told it wasn't true anemia but rather 'small cells'.   Review of Systems For ROS see HPI     Objective:   Physical Exam  Vitals reviewed. Constitutional: She is oriented to person, place, and time. She appears well-developed and well-nourished. No distress.  HENT:  Head: Normocephalic and atraumatic.  Eyes: Conjunctivae and EOM are normal. Pupils are equal, round, and reactive to light.  Neck: Normal range of motion. Neck supple. No thyromegaly present.  Cardiovascular: Normal rate, regular rhythm, normal heart sounds and intact distal pulses.   No murmur heard. Pulmonary/Chest: Effort normal and breath sounds normal. No respiratory distress.  Abdominal: Soft. She exhibits no distension. There is no tenderness.  Musculoskeletal: She exhibits no edema.  Lymphadenopathy:    She has no cervical adenopathy.  Neurological: She is alert and oriented to person, place, and time.  Skin: Skin is warm and dry.  Psychiatric: She has a normal mood and affect. Her behavior is normal.          Assessment & Plan:

## 2013-07-11 NOTE — Assessment & Plan Note (Signed)
New to provider.  Noted to have mildly elevated LDL at last check.  Not exercising or following diet.  Check labs.  Start meds prn.

## 2013-07-11 NOTE — Assessment & Plan Note (Signed)
New to provider, ongoing for pt.  Has not taken meds this AM.  Asymptomatic.  Check labs.  No anticipated med changes.

## 2013-07-11 NOTE — Assessment & Plan Note (Signed)
New to provider, hx of mild anemia for pt.  Check labs.  Start iron as needed and if Hgb is low, pt will complete iFOB.  Pt expressed understanding and is in agreement w/ plan.

## 2013-07-12 ENCOUNTER — Other Ambulatory Visit: Payer: Self-pay | Admitting: General Practice

## 2013-07-12 MED ORDER — POTASSIUM CHLORIDE CRYS ER 20 MEQ PO TBCR: 20.0000 meq | EXTENDED_RELEASE_TABLET | Freq: Every day | ORAL | Status: DC

## 2013-07-14 ENCOUNTER — Telehealth: Payer: Self-pay | Admitting: Family Medicine

## 2013-07-14 ENCOUNTER — Ambulatory Visit (INDEPENDENT_AMBULATORY_CARE_PROVIDER_SITE_OTHER): Payer: BC Managed Care – PPO | Admitting: Family Medicine

## 2013-07-14 ENCOUNTER — Encounter: Payer: Self-pay | Admitting: Family Medicine

## 2013-07-14 ENCOUNTER — Other Ambulatory Visit: Payer: Self-pay | Admitting: Family Medicine

## 2013-07-14 VITALS — BP 132/78 | HR 80 | Temp 98.1°F | Wt 160.4 lb

## 2013-07-14 DIAGNOSIS — J329 Chronic sinusitis, unspecified: Secondary | ICD-10-CM

## 2013-07-14 DIAGNOSIS — J019 Acute sinusitis, unspecified: Secondary | ICD-10-CM

## 2013-07-14 MED ORDER — GUAIFENESIN-CODEINE 100-10 MG/5ML PO SYRP: ORAL_SOLUTION | ORAL | Status: DC

## 2013-07-14 MED ORDER — CEFUROXIME AXETIL 500 MG PO TABS: 500.0000 mg | ORAL_TABLET | Freq: Two times a day (BID) | ORAL | Status: AC

## 2013-07-14 MED ORDER — AMOXICILLIN 875 MG PO TABS: 875.0000 mg | ORAL_TABLET | Freq: Two times a day (BID) | ORAL | Status: DC

## 2013-07-14 MED ORDER — BECLOMETHASONE DIPROPIONATE 80 MCG/ACT NA AERS: 2.0000 | INHALATION_SPRAY | Freq: Every day | NASAL | Status: DC

## 2013-07-14 NOTE — Telephone Encounter (Signed)
Patient states the antibiotic we prescribed is too expensive ($61) and she would like an alternative. Please advise.

## 2013-07-14 NOTE — Telephone Encounter (Signed)
Patient called and waned to know what kind of medicine would dr Beverely Low recommend over the counter for a cold possibly. She is concern about what medication she should take because she does not want her blood pressure to go up. Also i made her an appt to see dr Laury Axon this afternoon thanks

## 2013-07-14 NOTE — Telephone Encounter (Signed)
Please advise      KP 

## 2013-07-14 NOTE — Telephone Encounter (Signed)
Amoxicillin sent to pharmacy

## 2013-07-14 NOTE — Progress Notes (Signed)
  Subjective:     Loretta Greene is a 42 y.o. female who presents for evaluation of sinus pain. Symptoms include: congestion, facial pain, mouth breathing, nasal congestion, post nasal drip and sinus pressure. Onset of symptoms was 1 week ago. Symptoms have been gradually worsening since that time. Past history is significant for no history of pneumonia or bronchitis. Patient is a non-smoker.  The following portions of the patient's history were reviewed and updated as appropriate: allergies, current medications, past family history, past medical history, past social history, past surgical history and problem list.  Review of Systems Pertinent items are noted in HPI.   Objective:    BP 132/78  Pulse 80  Temp(Src) 98.1 F (36.7 C) (Oral)  Wt 160 lb 6.4 oz (72.757 kg)  BMI 29.82 kg/m2  SpO2 98% General appearance: alert, cooperative, appears stated age and no distress Ears: normal TM's and external ear canals both ears Nose: green discharge, mild congestion, turbinates red, swollen, sinus tenderness bilateral Throat: lips, mucosa, and tongue normal; teeth and gums normal Neck: mild anterior cervical adenopathy, supple, symmetrical, trachea midline and thyroid not enlarged, symmetric, no tenderness/mass/nodules Lungs: clear to auscultation bilaterally Heart: S1, S2 normal    Assessment:    Acute bacterial sinusitis.    Plan:    Nasal steroids per medication orders. Antihistamines per medication orders. Ceftin per medication orders.  qnasl

## 2013-07-14 NOTE — Telephone Encounter (Signed)
Vm left making the patient aware             KP

## 2013-07-14 NOTE — Patient Instructions (Signed)

## 2013-07-14 NOTE — Telephone Encounter (Signed)
FYI. On your pt this afternoon.

## 2013-08-17 ENCOUNTER — Ambulatory Visit: Payer: BC Managed Care – PPO | Admitting: Family Medicine

## 2013-08-17 ENCOUNTER — Telehealth: Payer: Self-pay | Admitting: Family Medicine

## 2013-08-17 NOTE — Telephone Encounter (Signed)
Patient called stating that she periodically has chest pain. She was unable to come in today due to not having gas to get here. She gets paid tomorrow and will come right after work. Denies any pain currently.

## 2013-08-18 ENCOUNTER — Encounter: Payer: Self-pay | Admitting: Family Medicine

## 2013-08-18 ENCOUNTER — Ambulatory Visit (INDEPENDENT_AMBULATORY_CARE_PROVIDER_SITE_OTHER): Payer: BC Managed Care – PPO | Admitting: Family Medicine

## 2013-08-18 VITALS — BP 140/76 | HR 70 | Temp 97.3°F | Resp 16 | Wt 165.4 lb

## 2013-08-18 DIAGNOSIS — R079 Chest pain, unspecified: Secondary | ICD-10-CM

## 2013-08-18 DIAGNOSIS — R197 Diarrhea, unspecified: Secondary | ICD-10-CM

## 2013-08-18 DIAGNOSIS — R42 Dizziness and giddiness: Secondary | ICD-10-CM

## 2013-08-18 DIAGNOSIS — R002 Palpitations: Secondary | ICD-10-CM

## 2013-08-18 LAB — HEPATIC FUNCTION PANEL
ALT: 10 U/L (ref 0–35)
AST: 11 U/L (ref 0–37)
Alkaline Phosphatase: 40 U/L (ref 39–117)
Bilirubin, Direct: 0.1 mg/dL (ref 0.0–0.3)
Indirect Bilirubin: 0.5 mg/dL (ref 0.0–0.9)
Total Bilirubin: 0.6 mg/dL (ref 0.3–1.2)

## 2013-08-18 LAB — CBC WITH DIFFERENTIAL/PLATELET
Basophils Absolute: 0 10*3/uL (ref 0.0–0.1)
Basophils Relative: 1 % (ref 0–1)
Eosinophils Absolute: 0.2 10*3/uL (ref 0.0–0.7)
Hemoglobin: 10 g/dL — ABNORMAL LOW (ref 12.0–15.0)
MCH: 27.4 pg (ref 26.0–34.0)
MCV: 81.4 fL (ref 78.0–100.0)
Neutro Abs: 2.4 10*3/uL (ref 1.7–7.7)
RBC: 3.65 MIL/uL — ABNORMAL LOW (ref 3.87–5.11)
WBC: 5.6 10*3/uL (ref 4.0–10.5)

## 2013-08-18 LAB — BASIC METABOLIC PANEL
BUN: 14 mg/dL (ref 6–23)
Calcium: 8.8 mg/dL (ref 8.4–10.5)
Chloride: 105 mEq/L (ref 96–112)
Potassium: 3.7 mEq/L (ref 3.5–5.3)
Sodium: 141 mEq/L (ref 135–145)

## 2013-08-18 NOTE — Progress Notes (Signed)
  Subjective:    Patient ID: Loretta Greene, female    DOB: 1971-06-23, 42 y.o.   MRN: [id number]  HPI Pre visit review using our clinic review tool, if applicable. No additional management support is needed unless otherwise documented below in the visit note.   Palpitations, light headed, HA, decreased appetite, loose stools.  Dizziness started 1 week ago.  Palpitations x4 days.  Loose stools after eating x1 week.  No fever.  No facial pain/pressure.  Mild nausea, no vomiting.  Light headedness described as 'feeling off balance'.  No particular trigger for dizziness- not w/ turning head, rolling over, changing position.   Review of Systems For ROS see HPI     Objective:   Physical Exam  Vitals reviewed. Constitutional: She is oriented to person, place, and time. She appears well-developed and well-nourished. No distress.  HENT:  Head: Normocephalic and atraumatic.  Mouth/Throat: Uvula is midline and mucous membranes are normal.  TMs WNL No TTP over sinuses Minimal nasal congestion  Eyes: Conjunctivae and EOM are normal. Pupils are equal, round, and reactive to light.  2-3 beats of horizontal nystagmus  Neck: Normal range of motion. Neck supple.  Cardiovascular: Normal rate, regular rhythm, normal heart sounds and intact distal pulses.   Pulmonary/Chest: Effort normal and breath sounds normal. No respiratory distress. She has no wheezes. She has no rales.  Musculoskeletal: She exhibits no edema.  Lymphadenopathy:    She has no cervical adenopathy.  Neurological: She is alert and oriented to person, place, and time. She has normal reflexes. No cranial nerve deficit.  Skin: Skin is warm and dry.  Psychiatric: She has a normal mood and affect. Her behavior is normal. Judgment and thought content normal.          Assessment & Plan:

## 2013-08-18 NOTE — Patient Instructions (Signed)
Follow up if no better in 1 week We'll notify you of your lab results Drink plenty of fluids Change positions slowly Immodium as needed for diarrhea Try and eat regularly to avoid abdominal pain REST! Call if symptoms change or worsen Hang in there! Happy Holidays!!!

## 2013-08-19 LAB — TSH: TSH: 0.834 u[IU]/mL (ref 0.350–4.500)

## 2013-08-20 NOTE — Assessment & Plan Note (Addendum)
New.  Pt has hx of anemia- could be causing sxs.  Not orthostatic on exam.  No evidence of BPV.  Check labs to r/o thyroid or other cause.  Encouraged increased fluids, changing positions slowly.  Reviewed supportive care and red flags that should prompt return.  Pt expressed understanding and is in agreement w/ plan.

## 2013-08-20 NOTE — Assessment & Plan Note (Signed)
New.  Suspect viral illness.  Check labs to r/o infxn or biliary cause.  immodium prn.  Reviewed supportive care and red flags that should prompt return.  Pt expressed understanding and is in agreement w/ plan.

## 2013-08-20 NOTE — Assessment & Plan Note (Signed)
New.  No abnormality on EKG.  Check labs to r/o metabolic cause.  Will follow.

## 2013-08-21 ENCOUNTER — Other Ambulatory Visit: Payer: Self-pay | Admitting: Family Medicine

## 2013-08-21 DIAGNOSIS — D649 Anemia, unspecified: Secondary | ICD-10-CM

## 2013-08-21 MED ORDER — FERROUS SULFATE 325 (65 FE) MG PO TABS: 325.0000 mg | ORAL_TABLET | Freq: Every day | ORAL | Status: DC

## 2014-02-06 ENCOUNTER — Telehealth: Payer: Self-pay | Admitting: *Deleted

## 2014-02-06 DIAGNOSIS — I10 Essential (primary) hypertension: Secondary | ICD-10-CM

## 2014-02-06 MED ORDER — CHLORTHALIDONE 25 MG PO TABS: 25.0000 mg | ORAL_TABLET | Freq: Every day | ORAL | Status: DC

## 2014-02-06 MED ORDER — CARVEDILOL 25 MG PO TABS
ORAL_TABLET | ORAL | Status: DC
Start: 2014-02-06 — End: 2015-03-22

## 2014-02-06 NOTE — Telephone Encounter (Signed)
Med filled and pt notified.  

## 2014-02-06 NOTE — Telephone Encounter (Signed)
Caller name:  Veryl Speaktta Relation to pt:  self Call back number:  [phone] [facility] [address]  Reason for call:   Pt has been out of chlorthalidone (HYGROTON) 25 MG tablet and out of carvedilol (COREG) 25 MG tablet.  She has a medication follow up scheduled 02/21/2014.  Pt requesting a 1 month supply of each until she comes for her visit.  Please call and leave her msg to let her know.  chlorthalidone (HYGROTON) 25 MG tablet  Last filled 01/31/2013, #90, 1 refill  carvedilol (COREG) 25 MG tablet Last filled 01/31/2013, #270, 3 refills Last OV 08/18/2013

## 2014-02-21 ENCOUNTER — Ambulatory Visit: Payer: BC Managed Care – PPO | Admitting: Family Medicine

## 2014-03-23 ENCOUNTER — Ambulatory Visit: Payer: Self-pay | Admitting: Family Medicine

## 2014-10-31 ENCOUNTER — Ambulatory Visit (INDEPENDENT_AMBULATORY_CARE_PROVIDER_SITE_OTHER): Payer: 59 | Admitting: Family Medicine

## 2014-10-31 ENCOUNTER — Encounter: Payer: Self-pay | Admitting: Family Medicine

## 2014-10-31 VITALS — BP 130/86 | HR 78 | Temp 98.2°F | Resp 16 | Wt 171.5 lb

## 2014-10-31 DIAGNOSIS — E785 Hyperlipidemia, unspecified: Secondary | ICD-10-CM

## 2014-10-31 DIAGNOSIS — E669 Obesity, unspecified: Secondary | ICD-10-CM | POA: Insufficient documentation

## 2014-10-31 DIAGNOSIS — R5383 Other fatigue: Secondary | ICD-10-CM

## 2014-10-31 DIAGNOSIS — I10 Essential (primary) hypertension: Secondary | ICD-10-CM

## 2014-10-31 LAB — LIPID PANEL
CHOLESTEROL: 163 mg/dL (ref 0–200)
HDL: 37.4 mg/dL — AB (ref >39.00)
LDL CALC: 97 mg/dL (ref 0–99)
NONHDL: 125.6
TRIGLYCERIDES: 144 mg/dL (ref 0.0–149.0)
Total CHOL/HDL Ratio: 4
VLDL: 28.8 mg/dL (ref 0.0–40.0)

## 2014-10-31 LAB — HEPATIC FUNCTION PANEL
ALT: 12 U/L (ref 0–35)
AST: 12 U/L (ref 0–37)
Albumin: 3.8 g/dL (ref 3.5–5.2)
Alkaline Phosphatase: 45 U/L (ref 39–117)
Bilirubin, Direct: 0.1 mg/dL (ref 0.0–0.3)
Total Bilirubin: 0.6 mg/dL (ref 0.2–1.2)
Total Protein: 6.9 g/dL (ref 6.0–8.3)

## 2014-10-31 LAB — CBC WITH DIFFERENTIAL/PLATELET
BASOS ABS: 0 10*3/uL (ref 0.0–0.1)
Basophils Relative: 0.6 % (ref 0.0–3.0)
Eosinophils Absolute: 0.2 10*3/uL (ref 0.0–0.7)
Eosinophils Relative: 3.8 % (ref 0.0–5.0)
HEMATOCRIT: 32.3 % — AB (ref 36.0–46.0)
HEMOGLOBIN: 10.8 g/dL — AB (ref 12.0–15.0)
LYMPHS ABS: 1.8 10*3/uL (ref 0.7–4.0)
Lymphocytes Relative: 30.9 % (ref 12.0–46.0)
MCHC: 33.2 g/dL (ref 30.0–36.0)
MCV: 82.5 fl (ref 78.0–100.0)
MONOS PCT: 6.8 % (ref 3.0–12.0)
Monocytes Absolute: 0.4 10*3/uL (ref 0.1–1.0)
NEUTROS ABS: 3.4 10*3/uL (ref 1.4–7.7)
Neutrophils Relative %: 57.9 % (ref 43.0–77.0)
Platelets: 383 10*3/uL (ref 150.0–400.0)
RBC: 3.92 Mil/uL (ref 3.87–5.11)
RDW: 13.3 % (ref 11.5–15.5)
WBC: 5.8 10*3/uL (ref 4.0–10.5)

## 2014-10-31 LAB — BASIC METABOLIC PANEL WITH GFR
BUN: 12 mg/dL (ref 6–23)
CO2: 31 meq/L (ref 19–32)
Calcium: 8.9 mg/dL (ref 8.4–10.5)
Chloride: 103 meq/L (ref 96–112)
Creatinine, Ser: 0.57 mg/dL (ref 0.40–1.20)
GFR: 147.54 mL/min
Glucose, Bld: 102 mg/dL — ABNORMAL HIGH (ref 70–99)
Potassium: 3.6 meq/L (ref 3.5–5.1)
Sodium: 138 meq/L (ref 135–145)

## 2014-10-31 LAB — VITAMIN D 25 HYDROXY (VIT D DEFICIENCY, FRACTURES): VITD: 15.74 ng/mL — ABNORMAL LOW (ref 30.00–100.00)

## 2014-10-31 LAB — B12 AND FOLATE PANEL
FOLATE: 11.6 ng/mL (ref >5.9)
Vitamin B-12: 313 pg/mL (ref 211–911)

## 2014-10-31 LAB — TSH: TSH: 1.46 u[IU]/mL (ref 0.35–4.50)

## 2014-10-31 LAB — HEMOGLOBIN A1C: Hgb A1c MFr Bld: 6.1 % (ref 4.6–6.5)

## 2014-10-31 NOTE — Assessment & Plan Note (Signed)
Chronic problem.  Noted on previous labs.  Pt not currently on medication.  Check labs.  Start meds prn.

## 2014-10-31 NOTE — Progress Notes (Signed)
Pre visit review using our clinic review tool, if applicable. No additional management support is needed unless otherwise documented below in the visit note. 

## 2014-10-31 NOTE — Assessment & Plan Note (Signed)
Chronic problem.  Tolerating meds w/o difficulty but due to diuretic use, is overdue for labs.  No anticipated med changes but will follow.

## 2014-10-31 NOTE — Progress Notes (Signed)
   Subjective:    Patient ID: Loretta Greene, female    DOB: 19-Sep-1971, 44 y.o.   MRN: [id number]  HPI Fatigue- pt started working 2 jobs, 'my diet got crazy', 'i was only sleeping 20 hrs/week'.  Now w/ HAs, increased hunger but losing weight.  Increased urination.  Increased thirst.  Mild nausea, some dizziness.  + family hx of DM.  Pt plans to start exercising.  Review of Systems For ROS see HPI   Reviewed meds, allergies, problem list, and PMH in chart    Objective:   Physical Exam  Constitutional: She is oriented to person, place, and time. She appears well-developed and well-nourished. No distress.  HENT:  Head: Normocephalic and atraumatic.  Eyes: Conjunctivae and EOM are normal. Pupils are equal, round, and reactive to light.  Neck: Normal range of motion. Neck supple. No thyromegaly present.  Cardiovascular: Normal rate, regular rhythm, normal heart sounds and intact distal pulses.   No murmur heard. Pulmonary/Chest: Effort normal and breath sounds normal. No respiratory distress.  Abdominal: Soft. She exhibits no distension. There is no tenderness.  Musculoskeletal: She exhibits no edema.  Lymphadenopathy:    She has no cervical adenopathy.  Neurological: She is alert and oriented to person, place, and time.  Skin: Skin is warm and dry.  Psychiatric: She has a normal mood and affect. Her behavior is normal.  Vitals reviewed.         Assessment & Plan:

## 2014-10-31 NOTE — Patient Instructions (Signed)
Follow up in 4-6 weeks to recheck fatigue We'll notify you of your lab results and make any changes if needed Try and make healthy food choices, get regular exercise, limit the soda/sugar intake REST! Call with any questions or concerns Happy Early Birthday!!!

## 2014-10-31 NOTE — Assessment & Plan Note (Signed)
New.  Check labs to r/o thyroid abnormality, anemia, electrolyte disturbance, vitamin deficiency.  This may be due to pt's poor and limited sleep along w/ unhealthy lifestyle choices- limited exercise, poor diet.  Encouraged her to make small changes.  Will treat lab abnormalities if present.  Will follow.

## 2014-10-31 NOTE — Assessment & Plan Note (Signed)
Chronic problem.  Pt feels she has been losing weight w/o trying and despite poor diet.  Our records show she has gained but there is a large interval gap and she may have actually gained and then started losing.  Encouraged healthier food and drink choices along w/ regular exercise.  Check labs to risk stratify.  Will follow.

## 2014-11-01 ENCOUNTER — Other Ambulatory Visit: Payer: Self-pay | Admitting: General Practice

## 2014-11-01 ENCOUNTER — Telehealth: Payer: Self-pay | Admitting: Family Medicine

## 2014-11-01 MED ORDER — VITAMIN D (ERGOCALCIFEROL) 1.25 MG (50000 UNIT) PO CAPS: 50000.0000 [IU] | ORAL_CAPSULE | ORAL | Status: DC

## 2014-11-01 NOTE — Telephone Encounter (Signed)
Pt.notified

## 2014-11-01 NOTE — Telephone Encounter (Signed)
Caller name:Sens, Stina Relation to UJ:WJXBpt:self Call back number:[phone] Pharmacy:  Reason for call: pt would like to know if her lab results are back, labs were done yesterday.

## 2014-12-26 ENCOUNTER — Telehealth: Payer: Self-pay | Admitting: Family Medicine

## 2014-12-26 NOTE — Telephone Encounter (Signed)
Pre Visit letter sent  °

## 2015-01-15 ENCOUNTER — Encounter: Payer: Self-pay | Admitting: *Deleted

## 2015-01-15 ENCOUNTER — Telehealth: Payer: Self-pay | Admitting: *Deleted

## 2015-01-15 NOTE — Telephone Encounter (Signed)
Unable to reach patient at time of Pre-Visit Call.  Left message for patient to return call when available.    

## 2015-01-15 NOTE — Addendum Note (Signed)
Addended by: Noreene LarssonLARSON, Lanier Millon A on: 01/15/2015 04:07 PM   Modules accepted: Medications

## 2015-01-15 NOTE — Telephone Encounter (Signed)
Pre-Visit Call completed with patient and chart updated.   Pre-Visit Info documented in Specialty Comments under [employer].    

## 2015-01-16 ENCOUNTER — Ambulatory Visit (INDEPENDENT_AMBULATORY_CARE_PROVIDER_SITE_OTHER): Payer: 59 | Admitting: Family Medicine

## 2015-01-16 ENCOUNTER — Encounter: Payer: Self-pay | Admitting: Family Medicine

## 2015-01-16 VITALS — BP 128/82 | HR 79 | Temp 98.0°F | Resp 16 | Ht 62.0 in | Wt 168.1 lb

## 2015-01-16 DIAGNOSIS — Z0000 Encounter for general adult medical examination without abnormal findings: Secondary | ICD-10-CM | POA: Insufficient documentation

## 2015-01-16 DIAGNOSIS — M545 Low back pain, unspecified: Secondary | ICD-10-CM | POA: Insufficient documentation

## 2015-01-16 DIAGNOSIS — R32 Unspecified urinary incontinence: Secondary | ICD-10-CM | POA: Diagnosis not present

## 2015-01-16 LAB — BASIC METABOLIC PANEL WITH GFR
BUN: 9 mg/dL (ref 6–23)
CO2: 31 meq/L (ref 19–32)
Calcium: 9.1 mg/dL (ref 8.4–10.5)
Chloride: 103 meq/L (ref 96–112)
Creatinine, Ser: 0.58 mg/dL (ref 0.40–1.20)
GFR: 144.47 mL/min
Glucose, Bld: 102 mg/dL — ABNORMAL HIGH (ref 70–99)
Potassium: 3.5 meq/L (ref 3.5–5.1)
Sodium: 136 meq/L (ref 135–145)

## 2015-01-16 LAB — LIPID PANEL
CHOLESTEROL: 169 mg/dL (ref 0–200)
HDL: 33.5 mg/dL — AB (ref >39.00)
LDL CALC: 96 mg/dL (ref 0–99)
NonHDL: 135.5
TRIGLYCERIDES: 197 mg/dL — AB (ref 0.0–149.0)
Total CHOL/HDL Ratio: 5
VLDL: 39.4 mg/dL (ref 0.0–40.0)

## 2015-01-16 LAB — CBC WITH DIFFERENTIAL/PLATELET
BASOS ABS: 0 10*3/uL (ref 0.0–0.1)
Basophils Relative: 0.4 % (ref 0.0–3.0)
EOS ABS: 0.2 10*3/uL (ref 0.0–0.7)
Eosinophils Relative: 2.5 % (ref 0.0–5.0)
HCT: 34.1 % — ABNORMAL LOW (ref 36.0–46.0)
Hemoglobin: 11.3 g/dL — ABNORMAL LOW (ref 12.0–15.0)
LYMPHS ABS: 2.1 10*3/uL (ref 0.7–4.0)
LYMPHS PCT: 30.5 % (ref 12.0–46.0)
MCHC: 33.2 g/dL (ref 30.0–36.0)
MCV: 83.1 fl (ref 78.0–100.0)
MONO ABS: 0.4 10*3/uL (ref 0.1–1.0)
Monocytes Relative: 6.1 % (ref 3.0–12.0)
NEUTROS ABS: 4.1 10*3/uL (ref 1.4–7.7)
Neutrophils Relative %: 60.5 % (ref 43.0–77.0)
Platelets: 375 10*3/uL (ref 150.0–400.0)
RBC: 4.1 Mil/uL (ref 3.87–5.11)
RDW: 13.6 % (ref 11.5–15.5)
WBC: 6.7 10*3/uL (ref 4.0–10.5)

## 2015-01-16 LAB — HEPATIC FUNCTION PANEL
ALT: 15 U/L (ref 0–35)
AST: 15 U/L (ref 0–37)
Albumin: 4 g/dL (ref 3.5–5.2)
Alkaline Phosphatase: 46 U/L (ref 39–117)
Bilirubin, Direct: 0.1 mg/dL (ref 0.0–0.3)
Total Bilirubin: 0.4 mg/dL (ref 0.2–1.2)
Total Protein: 7.3 g/dL (ref 6.0–8.3)

## 2015-01-16 LAB — VITAMIN D 25 HYDROXY (VIT D DEFICIENCY, FRACTURES): VITD: 33.54 ng/mL (ref 30.00–100.00)

## 2015-01-16 MED ORDER — OMEPRAZOLE 20 MG PO CPDR
20.0000 mg | DELAYED_RELEASE_CAPSULE | Freq: Every day | ORAL | Status: DC
Start: 2015-01-16 — End: 2015-04-05

## 2015-01-16 MED ORDER — CYCLOBENZAPRINE HCL 10 MG PO TABS: 10.0000 mg | ORAL_TABLET | Freq: Three times a day (TID) | ORAL | Status: DC | PRN

## 2015-01-16 MED ORDER — MELOXICAM 15 MG PO TABS: 15.0000 mg | ORAL_TABLET | Freq: Every day | ORAL | Status: DC

## 2015-01-16 NOTE — Progress Notes (Signed)
   Subjective:    Patient ID: Loretta Greene, female    DOB: [birth year], 44 y.o.   MRN: [id number]  HPI CPE- due for mammo (pt received letter indicating she needs 3D mammo), plans to schedule.  + LBP- pt was in [place] 'years ago[place]  Pain radiates across lower back.  Pain will occur at night and increased stiffness in AM.  No radiation of pain into buttocks or thighs.  Denies weakness/numbness of legs.  Pt reports mattress is 'too old'.   Review of Systems Patient reports no vision/ hearing changes, adenopathy,fever, weight change,  persistant/recurrent hoarseness , swallowing issues, chest pain, palpitations, edema, persistant/recurrent cough, hemoptysis, dyspnea (rest/exertional/paroxysmal nocturnal), gastrointestinal bleeding (melena, rectal bleeding), abdominal pain, significant heartburn, Gyn symptoms (abnormal  bleeding, pain),  syncope, focal weakness, memory loss, numbness & tingling, skin/hair/nail changes, abnormal bruising or bleeding, anxiety, or depression.   + loose bowels after eating + itching- denies dry skin, no rash + Bladder leakage- pt has mesh and is questioning whether this needs to be removed    Objective:   Physical Exam General Appearance:    Alert, cooperative, no distress, appears stated age  Head:    Normocephalic, without obvious abnormality, atraumatic  Eyes:    PERRL, conjunctiva/corneas clear, EOM's intact, fundi    benign, both eyes  Ears:    Normal TM's and external ear canals, both ears  Nose:   Nares normal, septum midline, mucosa normal, no drainage    or sinus tenderness  Throat:   Lips, mucosa, and tongue normal; teeth and gums normal  Neck:   Supple, symmetrical, trachea midline, no adenopathy;    Thyroid: no enlargement/tenderness/nodules  Back:     Symmetric, no curvature, ROM normal, no CVA tenderness  Lungs:     Clear to auscultation bilaterally, respirations unlabored  Chest Wall:    No tenderness or deformity   Heart:     Regular rate and rhythm, S1 and S2 normal, no murmur, rub   or gallop  Breast Exam:    Deferred to GYN  Abdomen:     Soft, non-tender, bowel sounds active all four quadrants,    no masses, no organomegaly  Genitalia:    Deferred to GYN  Rectal:    Extremities:   Extremities normal, atraumatic, no cyanosis or edema  Pulses:   2+ and symmetric all extremities  Skin:   Skin color, texture, turgor normal, no rashes or lesions  Lymph nodes:   Cervical, supraclavicular, and axillary nodes normal  Neurologic:   CNII-XII intact, normal strength, sensation and reflexes    throughout          Assessment & Plan:

## 2015-01-16 NOTE — Assessment & Plan Note (Signed)
Pt's PE WNL w/ exception of obesity.  Due for mammo- encouraged her to schedule.  UTD on pap and no need to repeat due to hysterectomy.  Check labs.  Anticipatory guidance provided.

## 2015-01-16 NOTE — Progress Notes (Signed)
Pre visit review using our clinic review tool, if applicable. No additional management support is needed unless otherwise documented below in the visit note. 

## 2015-01-16 NOTE — Assessment & Plan Note (Signed)
New to provider, ongoing for pt.  Pt to start NSAID PRN, flexeril prior to bed prn.  Heat.  If no improvement will need referral to ortho.  Pt expressed understanding and is in agreement w/ plan.

## 2015-01-16 NOTE — Assessment & Plan Note (Signed)
New to provider, ongoing for pt.  She has mesh in place and is part of a class action law suit.  Wants to have this removed but fears that her leakage will increase.  Will refer to urology for complete discussion and treatment options.

## 2015-01-16 NOTE — Patient Instructions (Signed)
Follow up in 6 months to recheck BP- sooner if needed Call and schedule your mammo We'll notify you of your lab results and make any changes if needed Start daily Claritin or Zyrtec to decrease the itching Try and limit the fat in your diet to avoid loose bowels We'll call you with your urology appt for the bladder Start the Flexeril as needed for back spasm and the mobic for inflammation- only as needed HEAT for back pain Call with any questions or concerns Happy Spring!!

## 2015-01-17 ENCOUNTER — Encounter: Payer: Self-pay | Admitting: General Practice

## 2015-01-17 LAB — TSH: TSH: 0.78 u[IU]/mL (ref 0.35–4.50)

## 2015-02-04 ENCOUNTER — Other Ambulatory Visit: Payer: Self-pay

## 2015-02-04 DIAGNOSIS — Z1231 Encounter for screening mammogram for malignant neoplasm of breast: Secondary | ICD-10-CM

## 2015-02-27 ENCOUNTER — Ambulatory Visit: Payer: 59

## 2015-03-22 ENCOUNTER — Ambulatory Visit (INDEPENDENT_AMBULATORY_CARE_PROVIDER_SITE_OTHER): Payer: 59 | Admitting: Family Medicine

## 2015-03-22 ENCOUNTER — Encounter: Payer: Self-pay | Admitting: Family Medicine

## 2015-03-22 ENCOUNTER — Telehealth: Payer: Self-pay | Admitting: Family Medicine

## 2015-03-22 ENCOUNTER — Encounter: Payer: Self-pay | Admitting: General Practice

## 2015-03-22 VITALS — BP 164/92 | HR 76 | Temp 98.2°F | Resp 16 | Wt 172.1 lb

## 2015-03-22 DIAGNOSIS — I10 Essential (primary) hypertension: Secondary | ICD-10-CM

## 2015-03-22 DIAGNOSIS — R252 Cramp and spasm: Secondary | ICD-10-CM

## 2015-03-22 MED ORDER — POTASSIUM CHLORIDE CRYS ER 20 MEQ PO TBCR: 20.0000 meq | EXTENDED_RELEASE_TABLET | Freq: Every day | ORAL | Status: DC

## 2015-03-22 MED ORDER — CHLORTHALIDONE 25 MG PO TABS: 25.0000 mg | ORAL_TABLET | Freq: Every day | ORAL | Status: DC

## 2015-03-22 NOTE — Telephone Encounter (Signed)
[facility] - Client TELEPHONE ADVICE RECORD [facility]  Patient Name: Loretta Greene  DOB: 11/14/1970    Initial Comment Caller says she has a throbbing headache on the back of her head and nausea. Has not vomited. No vision disturbances    Nurse Assessment  Nurse: Laural Benes, RN, Dondra Spry Date/Time Lamount Cohen Time): 03/22/2015 11:08:15 AM  Confirm and document reason for call. If symptomatic, describe symptoms. ---Veryl Speak has a throbbing headache in back of head pounding and nausea 180/70 blood pressure at this time. onset 330am this am  Has the patient traveled out of the country within the last 30 days? ---No  Does the patient require triage? ---Yes  Related visit to physician within the last 2 weeks? ---No  Does the PT have any chronic conditions? (i.e. diabetes, asthma, etc.) ---Yes  List chronic conditions. ---HTN  Did the patient indicate they were pregnant? ---No     Guidelines    Guideline Title Affirmed Question Affirmed Notes  High Blood Pressure BP ? 160/100    Final Disposition User   See PCP When Office is Open (within 3 days) Laural Benes, RN, Dondra Spry    Comments  Patient requests appt today dental on Monday -- elevated blood pressure and headache ----245pm 03-22-2015 Dr. Kirtland Bouchard. Tabori given

## 2015-03-22 NOTE — Telephone Encounter (Signed)
Appointment noted.

## 2015-03-22 NOTE — Progress Notes (Signed)
   Subjective:    Patient ID: Loretta Greene, female    DOB: [birth year], 44 y.o.   MRN: [id number]  HPI HTN- chronic problem, stopped meds last week when she ran out.  Since then has noticed increased HA, some dizziness.  No CP, SOB, HAs, visual changes.  Leg Cramps- pt is waking at night from sleep w/ severe calf spasm.  Will have residual leg aching the next day.  Not taking K+ supplement.   Review of Systems For ROS see HPI     Objective:   Physical Exam  Constitutional: She is oriented to person, place, and time. She appears well-developed and well-nourished. No distress.  HENT:  Head: Normocephalic and atraumatic.  Eyes: Conjunctivae and EOM are normal. Pupils are equal, round, and reactive to light.  Neck: Normal range of motion. Neck supple. No thyromegaly present.  Cardiovascular: Normal rate, regular rhythm, normal heart sounds and intact distal pulses.   No murmur heard. Pulmonary/Chest: Effort normal and breath sounds normal. No respiratory distress.  Abdominal: Soft. She exhibits no distension. There is no tenderness.  Musculoskeletal: She exhibits no edema.  Lymphadenopathy:    She has no cervical adenopathy.  Neurological: She is alert and oriented to person, place, and time.  Skin: Skin is warm and dry.  Psychiatric: She has a normal mood and affect. Her behavior is normal.  Vitals reviewed.         Assessment & Plan:

## 2015-03-22 NOTE — Patient Instructions (Signed)
Follow up in 2 weeks to recheck BP Start the Chlorthalidone daily (water pill) Restart the potassium daily Drink plenty of fluids AVOID the energy drinks Call with any questions or concerns Hang in there!!!

## 2015-03-22 NOTE — Progress Notes (Signed)
Pre visit review using our clinic review tool, if applicable. No additional management support is needed unless otherwise documented below in the visit note. 

## 2015-03-24 NOTE — Assessment & Plan Note (Signed)
Deteriorated.  Pt reports she stopped taking medication b/c she was feeling better (chlorthalidone) and also b/c the Coreg 'made me feel bad' (fatigue).  Based on this, will stop Coreg as pt turned to energy drinks b/c of the fatigue which subsequently worsened BP.  Restart Chlorthalidone.  Reviewed need to avoid Na and caffeine.  Will follow up in short order to ensure BP is returning to normal.  Reviewed supportive care and red flags that should prompt return.  Pt expressed understanding and is in agreement w/ plan.

## 2015-03-24 NOTE — Assessment & Plan Note (Signed)
New.  Nocturnal.  Suspect this is due to the fact that she stopped her K+ supplement.  Reviewed need for increased water intake, continued use of potassium- especially while on diuretic.  Reviewed supportive care and red flags that should prompt return.  Pt expressed understanding and is in agreement w/ plan.

## 2015-04-05 ENCOUNTER — Encounter: Payer: Self-pay | Admitting: Family Medicine

## 2015-04-05 ENCOUNTER — Ambulatory Visit (INDEPENDENT_AMBULATORY_CARE_PROVIDER_SITE_OTHER): Payer: 59 | Admitting: Family Medicine

## 2015-04-05 VITALS — BP 140/86 | HR 87 | Temp 98.1°F | Resp 16 | Ht 62.0 in | Wt 170.2 lb

## 2015-04-05 DIAGNOSIS — I10 Essential (primary) hypertension: Secondary | ICD-10-CM

## 2015-04-05 DIAGNOSIS — K219 Gastro-esophageal reflux disease without esophagitis: Secondary | ICD-10-CM | POA: Diagnosis not present

## 2015-04-05 LAB — BASIC METABOLIC PANEL
BUN: 12 mg/dL (ref 6–23)
CALCIUM: 9.6 mg/dL (ref 8.4–10.5)
CO2: 32 mEq/L (ref 19–32)
CREATININE: 0.61 mg/dL (ref 0.50–1.10)
Chloride: 100 mEq/L (ref 96–112)
Glucose, Bld: 128 mg/dL — ABNORMAL HIGH (ref 70–99)
Potassium: 3.3 mEq/L — ABNORMAL LOW (ref 3.5–5.3)
Sodium: 139 mEq/L (ref 135–145)

## 2015-04-05 MED ORDER — OMEPRAZOLE 40 MG PO CPDR: 40.0000 mg | DELAYED_RELEASE_CAPSULE | Freq: Every day | ORAL | Status: AC

## 2015-04-05 NOTE — Progress Notes (Signed)
Pre visit review using our clinic review tool, if applicable. No additional management support is needed unless otherwise documented below in the visit note. 

## 2015-04-05 NOTE — Progress Notes (Signed)
   Subjective:    Patient ID: Loretta Greene, female    DOB: 05-25-1971, 44 y.o.   MRN: [id number]  HPI HTN- BP is much improved today since pt restarted her Chlorthalidone.  Denies CP, SOB, HAs, visual changes, edema.  Indigestion- pt reports sxs have 'just started'.  Taking 2 tums w/ some improvement.  Taking Omeprazole but only 20mg  daily.  Denies N/V.   Review of Systems For ROS see HPI     Objective:   Physical Exam  Constitutional: She is oriented to person, place, and time. She appears well-developed and well-nourished. No distress.  HENT:  Head: Normocephalic and atraumatic.  Eyes: Conjunctivae and EOM are normal. Pupils are equal, round, and reactive to light.  Neck: Normal range of motion. Neck supple. No thyromegaly present.  Cardiovascular: Normal rate, regular rhythm, normal heart sounds and intact distal pulses.   No murmur heard. Pulmonary/Chest: Effort normal and breath sounds normal. No respiratory distress.  Abdominal: Soft. She exhibits no distension. There is no tenderness.  Musculoskeletal: She exhibits no edema.  Lymphadenopathy:    She has no cervical adenopathy.  Neurological: She is alert and oriented to person, place, and time.  Skin: Skin is warm and dry.  Psychiatric: She has a normal mood and affect. Her behavior is normal.  Vitals reviewed.         Assessment & Plan:

## 2015-04-05 NOTE — Patient Instructions (Signed)
Follow up in October to recheck BP We'll notify you of your lab results and make any changes if needed Increase the Omeprazole to 40mg  daily- 2 of what you have at home, 1 of the new prescription If the indigestion continues or you have chest pain/pressure- please go to the ER for evaluation REST!!!  You need to slow down! Call with any questions or concerns Have a great weekend!!

## 2015-04-07 NOTE — Assessment & Plan Note (Addendum)
Ongoing issue for pt.  Deteriorated recently.  Will increase Omeprazole to  daily.  Reviewed lifestyle and dietary modifications.  Will follow

## 2015-04-07 NOTE — Assessment & Plan Note (Signed)
Chronic problem.  Improved since restarting Chlorthalidone.  Asymptomatic at this time.  Check BMP.  Will follow closely.

## 2015-04-08 ENCOUNTER — Ambulatory Visit: Payer: 59

## 2015-04-15 ENCOUNTER — Ambulatory Visit
Admission: RE | Admit: 2015-04-15 | Discharge: 2015-04-15 | Disposition: A | Discharge status: Home / Self Care | Payer: 59 | Source: Ambulatory Visit

## 2015-04-15 DIAGNOSIS — Z1231 Encounter for screening mammogram for malignant neoplasm of breast: Secondary | ICD-10-CM

## 2015-05-22 ENCOUNTER — Encounter (HOSPITAL_BASED_OUTPATIENT_CLINIC_OR_DEPARTMENT_OTHER): Payer: Self-pay

## 2015-05-22 ENCOUNTER — Telehealth: Payer: Self-pay | Admitting: Family Medicine

## 2015-05-22 ENCOUNTER — Emergency Department (HOSPITAL_BASED_OUTPATIENT_CLINIC_OR_DEPARTMENT_OTHER): Payer: 59

## 2015-05-22 ENCOUNTER — Emergency Department (HOSPITAL_BASED_OUTPATIENT_CLINIC_OR_DEPARTMENT_OTHER)
Admission: EM | Admit: 2015-05-22 | Discharge: 2015-05-22 | Disposition: A | Discharge status: Home / Self Care | Payer: 59 | Attending: Emergency Medicine | Admitting: Emergency Medicine

## 2015-05-22 DIAGNOSIS — R0789 Other chest pain: Secondary | ICD-10-CM | POA: Diagnosis not present

## 2015-05-22 DIAGNOSIS — K219 Gastro-esophageal reflux disease without esophagitis: Secondary | ICD-10-CM | POA: Insufficient documentation

## 2015-05-22 DIAGNOSIS — Z79899 Other long term (current) drug therapy: Secondary | ICD-10-CM | POA: Insufficient documentation

## 2015-05-22 DIAGNOSIS — K30 Functional dyspepsia: Secondary | ICD-10-CM | POA: Insufficient documentation

## 2015-05-22 DIAGNOSIS — I10 Essential (primary) hypertension: Secondary | ICD-10-CM | POA: Diagnosis not present

## 2015-05-22 DIAGNOSIS — Z87891 Personal history of nicotine dependence: Secondary | ICD-10-CM | POA: Diagnosis not present

## 2015-05-22 DIAGNOSIS — R079 Chest pain, unspecified: Secondary | ICD-10-CM | POA: Diagnosis present

## 2015-05-22 LAB — CBC
HEMATOCRIT: 33.4 % — AB (ref 36.0–46.0)
HEMOGLOBIN: 11.1 g/dL — AB (ref 12.0–15.0)
MCH: 27.5 pg (ref 26.0–34.0)
MCHC: 33.2 g/dL (ref 30.0–36.0)
MCV: 82.9 fL (ref 78.0–100.0)
Platelets: 372 10*3/uL (ref 150–400)
RBC: 4.03 MIL/uL (ref 3.87–5.11)
RDW: 12.4 % (ref 11.5–15.5)
WBC: 8.4 10*3/uL (ref 4.0–10.5)

## 2015-05-22 LAB — BASIC METABOLIC PANEL
ANION GAP: 12 (ref 5–15)
BUN: 12 mg/dL (ref 6–20)
CHLORIDE: 94 mmol/L — AB (ref 101–111)
CO2: 30 mmol/L (ref 22–32)
Calcium: 9.2 mg/dL (ref 8.9–10.3)
Creatinine, Ser: 0.67 mg/dL (ref 0.44–1.00)
GFR calc Af Amer: >60 mL/min (ref >60)
GLUCOSE: 115 mg/dL — AB (ref 65–99)
POTASSIUM: 2.5 mmol/L — AB (ref 3.5–5.1)
Sodium: 136 mmol/L (ref 135–145)

## 2015-05-22 LAB — D-DIMER, QUANTITATIVE (NOT AT ARMC)

## 2015-05-22 LAB — TROPONIN I: Troponin I: <0.03 ng/mL (ref <0.031)

## 2015-05-22 MED ORDER — POTASSIUM CHLORIDE CRYS ER 20 MEQ PO TBCR
40.0000 meq | EXTENDED_RELEASE_TABLET | Freq: Once | ORAL | Status: AC
  Administered 2015-05-22: 40 meq via ORAL
  Filled 2015-05-22: qty 2

## 2015-05-22 NOTE — ED Notes (Signed)
C/o indigestion x over a month-PCP increased reflux meds in July-indigestion worse and chest tightness x 1 work

## 2015-05-22 NOTE — Discharge Instructions (Signed)
Your evaluated in the ED today for your chest discomfort. There does not appear to be an emergent cause her symptoms at this time. Your exam, labs, EKG and chest x-ray are all reassuring. Please follow-up with your doctors as needed for reevaluation. Return to ED for worsening symptoms.  Chest Pain (Nonspecific) It is often hard to give a specific diagnosis for the cause of chest pain. There is always a chance that your pain could be related to something serious, such as a heart attack or a blood clot in the lungs. You need to follow up with your health care provider for further evaluation. CAUSES   Heartburn.  Pneumonia or bronchitis.  Anxiety or stress.  Inflammation around your heart (pericarditis) or lung (pleuritis or pleurisy).  A blood clot in the lung.  A collapsed lung (pneumothorax). It can develop suddenly on its own (spontaneous pneumothorax) or from trauma to the chest.  Shingles infection (herpes zoster virus). The chest wall is composed of bones, muscles, and cartilage. Any of these can be the source of the pain.  The bones can be bruised by injury.  The muscles or cartilage can be strained by coughing or overwork.  The cartilage can be affected by inflammation and become sore (costochondritis). DIAGNOSIS  Lab tests or other studies may be needed to find the cause of your pain. Your health care provider may have you take a test called an ambulatory electrocardiogram (ECG). An ECG records your heartbeat patterns over a 24-hour period. You may also have other tests, such as:  Transthoracic echocardiogram (TTE). During echocardiography, sound waves are used to evaluate how blood flows through your heart.  Transesophageal echocardiogram (TEE).  Cardiac monitoring. This allows your health care provider to monitor your heart rate and rhythm in real time.  Holter monitor. This is a portable device that records your heartbeat and can help diagnose heart arrhythmias. It  allows your health care provider to track your heart activity for several days, if needed.  Stress tests by exercise or by giving medicine that makes the heart beat faster. TREATMENT   Treatment depends on what may be causing your chest pain. Treatment may include:  Acid blockers for heartburn.  Anti-inflammatory medicine.  Pain medicine for inflammatory conditions.  Antibiotics if an infection is present.  You may be advised to change lifestyle habits. This includes stopping smoking and avoiding alcohol, caffeine, and chocolate.  You may be advised to keep your head raised (elevated) when sleeping. This reduces the chance of acid going backward from your stomach into your esophagus. Most of the time, nonspecific chest pain will improve within 2-3 days with rest and mild pain medicine.  HOME CARE INSTRUCTIONS   If antibiotics were prescribed, take them as directed. Finish them even if you start to feel better.  For the next few days, avoid physical activities that bring on chest pain. Continue physical activities as directed.  Do not use any tobacco products, including cigarettes, chewing tobacco, or electronic cigarettes.  Avoid drinking alcohol.  Only take medicine as directed by your health care provider.  Follow your health care provider's suggestions for further testing if your chest pain does not go away.  Keep any follow-up appointments you made. If you do not go to an appointment, you could develop lasting (chronic) problems with pain. If there is any problem keeping an appointment, call to reschedule. SEEK MEDICAL CARE IF:   Your chest pain does not go away, even after treatment.  You have a  rash with blisters on your chest.  You have a fever. SEEK IMMEDIATE MEDICAL CARE IF:   You have increased chest pain or pain that spreads to your arm, neck, jaw, back, or abdomen.  You have shortness of breath.  You have an increasing cough, or you cough up blood.  You  have severe back or abdominal pain.  You feel nauseous or vomit.  You have severe weakness.  You faint.  You have chills. This is an emergency. Do not wait to see if the pain will go away. Get medical help at once. Call your local emergency services (911 in [place] Do not drive yourself to the hospital. MAKE SURE YOU:   Understand these instructions.  Will watch your condition.  Will get help right away if you are not doing well or get worse. Document Released: 06/24/2005 Document Revised: 09/19/2013 Document Reviewed: 04/19/2008 [facility] Patient Information 2015 [place], [place]. This information is not intended to replace advice given to you by your health care provider. Make sure you discuss any questions you have with your health care provider.

## 2015-05-22 NOTE — Telephone Encounter (Signed)
Relation to pt: self Call back number: [phone]  Pharmacy: [facility] [address] [place] - [address] [phone] (Phone) [phone] (Fax)          Reason for call:  Patient states omeprazole (PRILOSEC) 40 MG capsule  Is not working patient is experiencing pressure in her chest and hand feels tingly. Transferred patient to [facility].

## 2015-05-22 NOTE — Telephone Encounter (Signed)
Patient Name: Loretta Greene  DOB: [birth year]    Initial Comment Caller states having chest pressure; acid reflux meds not totally relieving;    Nurse Assessment  Nurse: Sherilyn Cooter, RN, Thurmond Butts Date/Time (Eastern Time): 05/22/2015 11:10:32 AM  Confirm and document reason for call. If symptomatic, describe symptoms. ---Caller states she has chest pressure that comes and goes, but it is happening more frequently now. She has "a little" of the chest pressure at present. Denies difficulty breathing and denies sweating. She currently takes reflux meds that normally takes it away. Here lately, it hasn't been doing anything to help her. She had not had the pain for the last 2 days, but it began again this morning.  Has the patient traveled out of the country within the last 30 days? ---No  Does the patient require triage? ---Yes  Related visit to physician within the last 2 weeks? ---No  Does the PT have any chronic conditions? (i.e. diabetes, asthma, etc.) ---Yes  List chronic conditions. ---HTN  Did the patient indicate they were pregnant? ---No     Guidelines    Guideline Title Affirmed Question Affirmed Notes  Chest Pain [1] Intermittent chest pain or "angina" AND [2] increasing in severity or frequency (Exception: pains lasting a few seconds)    Final Disposition User   Go to ED Now Sherilyn Cooter, RN, [place]    Referrals  [facility] - ED   Disagree/Comply: Comply

## 2015-05-22 NOTE — ED Provider Notes (Signed)
CSN: [id number]     Arrival date & time 05/22/15  1252 History   First MD Initiated Contact with Patient 05/22/15 1302     Chief Complaint  Patient presents with  . Gastrophageal Reflux     (Consider location/radiation/quality/duration/timing/severity/associated sxs/prior Treatment) HPI Loretta Greene is a 44 y.o. female with a history of abdominal hysterectomy, hypertension, GERD, comes in for evaluation of indigestion. Patient states she has had worsening indigestion since July, has seen her PCP for this problem. PCP has increased PPI dosage, but this does not help. Patient reports associated chest pressure that started over vacation this past week. Pressure was originally intermittent, but has been constant over the past 3 days. She rates her discomfort as a 4/10.  Chest pressure does not radiate and is not exertional. Sometimes eating worsens the discomfort as well as very deep respiration (feels discomfort in top of her throat), nothing seems to make it better. Patient reports driving to [place] last week for vacation. She denies any fevers, chills, shortness of breath, leg swelling, hemoptysis, cough, nausea or vomiting, diaphoresis, personal history of cancer, other URI symptoms. No history of asthma. No other aggravating or modifying factors.  Past Medical History  Diagnosis Date  . Hypertension   . HTN (hypertension) 12/19/2011  . GERD (gastroesophageal reflux disease)    Past Surgical History  Procedure Laterality Date  . Abdominal hysterectomy  05/14/2006    partial, still has ovaries   Family History  Problem Relation Age of Onset  . Diabetes Mother   . Hypertension Mother   . Heart disease Mother   . Dementia Mother   . Hypertension Father   . Kidney disease Father   . Heart disease Father   . Diabetes Father    Social History  Substance Use Topics  . Smoking status: Former Smoker    Quit date: 07/11/1990  . Smokeless tobacco: Never Used  . Alcohol Use: No   OB  History    No data available     Review of Systems A 10 point review of systems was completed and was negative except for pertinent positives and negatives as mentioned in the history of present illness     Allergies  Diovan hct  Home Medications   Prior to Admission medications   Medication Sig Start Date End Date Taking? Authorizing Provider  chlorthalidone (HYGROTON) 25 MG tablet Take 1 tablet (25 mg total) by mouth daily. 03/22/15   Sheliah Hatch, MD  Cholecalciferol (VITAMIN D) 2000 UNITS tablet Take 2,000 Units by mouth daily.    Historical Provider, MD  cyclobenzaprine (FLEXERIL) 10 MG tablet Take 1 tablet (10 mg total) by mouth 3 (three) times daily as needed for muscle spasms. 01/16/15   Sheliah Hatch, MD  ferrous sulfate 325 (65 FE) MG tablet Take 1 tablet (325 mg total) by mouth daily with breakfast. Patient not taking: Reported on 01/15/2015 08/21/13   Sheliah Hatch, MD  meloxicam (MOBIC) 15 MG tablet Take 1 tablet (15 mg total) by mouth daily. Patient not taking: Reported on 03/22/2015 01/16/15   Sheliah Hatch, MD  omeprazole (PRILOSEC) 40 MG capsule Take 1 capsule (40 mg total) by mouth daily. 04/05/15   Sheliah Hatch, MD  potassium chloride SA (K-DUR,KLOR-CON) 20 MEQ tablet Take 1 tablet (20 mEq total) by mouth daily. 03/22/15   Sheliah Hatch, MD   BP 127/56 mmHg  Pulse 89  Temp(Src) 98.3 F (36.8 C) (Oral)  Resp 18  Ht 5'  1" (1.549 m)  Wt 170 lb (77.111 kg)  BMI 32.14 kg/m2  SpO2 100% Physical Exam  Constitutional: She is oriented to person, place, and time. She appears well-developed and well-nourished.  HENT:  Head: Normocephalic and atraumatic.  Mouth/Throat: Oropharynx is clear and moist.  Eyes: Conjunctivae are normal. Pupils are equal, round, and reactive to light. Right eye exhibits no discharge. Left eye exhibits no discharge. No scleral icterus.  Neck: Normal range of motion. Neck supple.  Cardiovascular: Normal rate, regular  rhythm and normal heart sounds.  Exam reveals no gallop and no friction rub.   No murmur heard. Pulmonary/Chest: Effort normal and breath sounds normal. No respiratory distress. She has no wheezes. She has no rales.  Abdominal: Soft. She exhibits no distension and no mass. There is no tenderness. There is no rebound and no guarding.  Musculoskeletal: Normal range of motion. She exhibits no edema or tenderness.  Neurological: She is alert and oriented to person, place, and time.  Cranial Nerves II-XII grossly intact  Skin: Skin is warm and dry. No rash noted.  Psychiatric: She has a normal mood and affect.  Nursing note and vitals reviewed.   ED Course  Procedures (including critical care time) Labs Review Labs Reviewed  BASIC METABOLIC PANEL - Abnormal; Notable for the following:    Potassium 2.5 (*)    Chloride 94 (*)    Glucose, Bld 115 (*)    All other components within normal limits  CBC - Abnormal; Notable for the following:    Hemoglobin 11.1 (*)    HCT 33.4 (*)    All other components within normal limits  TROPONIN I  D-DIMER, QUANTITATIVE (NOT AT [facility])    Imaging Review Dg Chest 2 View  05/22/2015   CLINICAL DATA:  Indigestion for 1 month.  Chest pain for 1 week.  EXAM: CHEST  2 VIEW  COMPARISON:  09/28/2012  FINDINGS: The heart size and mediastinal contours are within normal limits. Both lungs are clear. The visualized skeletal structures are unremarkable.  IMPRESSION: No active cardiopulmonary disease.   Electronically Signed   By: Signa Kell M.D.   On: 05/22/2015 13:35   I have personally reviewed and evaluated these images and lab results as part of my medical decision-making.   EKG Interpretation   Date/Time:  Wednesday May 22 2015 13:09:48 EDT Ventricular Rate:  100 PR Interval:  192 QRS Duration: 84 QT Interval:  386 QTC Calculation: 497 R Axis:   73 Text Interpretation:  Normal sinus rhythm Nonspecific T wave abnormality  Prolonged QT Abnormal ECG  No old tracing to compare Confirmed by Mirian Mo [phone]) on 05/22/2015 1:25:04 PM     Meds given in ED:  Medications  potassium chloride SA (K-DUR,KLOR-CON) CR tablet 40 mEq (40 mEq Oral Given 05/22/15 1418)    Discharge Medication List as of 05/22/2015  3:02 PM     Filed Vitals:   05/22/15 1259 05/22/15 1515  BP: 144/81 127/56  Pulse: 94 89  Temp: 98.6 F (37 C) 98.3 F (36.8 C)  TempSrc: Oral Oral  Resp: 18 18  Height:  (1.549 m)   Weight: 170 lb (77.111 kg)   SpO2: 100% 100%    MDM  Vitals stable - WNL -afebrile Pt resting comfortably in ED.  PE--Lung exam normal. Cardiac auscultation reveals no murmurs rubs or gallops. Grossly Benign Physical Exam Labwork: Mild hypoK, given oral K (pt on oral K regimen and diuretic for HTN). Troponin negative. EKG reassuring.  D-Dimer negative Labs otherwise noncontributory Imaging: CXR shows no acute cardiopulmonary pathology  DDX: Patient with atypical/nonspecific chest discomfort for past week, constant for past 3 days. Clinical picture and exam today not consistent with ACS/dissection. Heart score 2. No evidence of spontaneous pneumothorax, esophageal rupture or other mediastinitis. PERC negative, doubt PE. No evidence of myocarditis, endocarditis, pericarditis.   I discussed all relevant lab findings and imaging results with pt and they verbalized understanding. Discussed f/u with PCP within 48 hrs and return precautions, pt very amenable to plan. Prior to patient discharge, I discussed and reviewed this case with Dr. Littie Deeds, who agrees with above plan.  Final diagnoses:  Atypical chest pain        Joycie Peek, PA-C 05/23/15 1222  Mirian Mo, MD 05/29/15 [phone]

## 2015-05-22 NOTE — Telephone Encounter (Signed)
Per pt's chart, she is currently in the ER.

## 2015-07-22 ENCOUNTER — Ambulatory Visit: Payer: 59 | Admitting: Family Medicine

## 2015-10-31 ENCOUNTER — Encounter (HOSPITAL_COMMUNITY): Payer: Self-pay | Admitting: Emergency Medicine

## 2015-10-31 ENCOUNTER — Emergency Department (HOSPITAL_COMMUNITY): Payer: Managed Care, Other (non HMO)

## 2015-10-31 ENCOUNTER — Emergency Department (HOSPITAL_COMMUNITY)
Admission: EM | Admit: 2015-10-31 | Discharge: 2015-10-31 | Disposition: A | Discharge status: Home / Self Care | Payer: Managed Care, Other (non HMO) | Attending: Emergency Medicine | Admitting: Emergency Medicine

## 2015-10-31 DIAGNOSIS — S0990XA Unspecified injury of head, initial encounter: Secondary | ICD-10-CM | POA: Diagnosis not present

## 2015-10-31 DIAGNOSIS — W19XXXA Unspecified fall, initial encounter: Secondary | ICD-10-CM

## 2015-10-31 DIAGNOSIS — Y990 Civilian activity done for income or pay: Secondary | ICD-10-CM | POA: Diagnosis not present

## 2015-10-31 DIAGNOSIS — Z87891 Personal history of nicotine dependence: Secondary | ICD-10-CM | POA: Diagnosis not present

## 2015-10-31 DIAGNOSIS — Y9289 Other specified places as the place of occurrence of the external cause: Secondary | ICD-10-CM | POA: Insufficient documentation

## 2015-10-31 DIAGNOSIS — W07XXXA Fall from chair, initial encounter: Secondary | ICD-10-CM | POA: Diagnosis not present

## 2015-10-31 DIAGNOSIS — S29002A Unspecified injury of muscle and tendon of back wall of thorax, initial encounter: Secondary | ICD-10-CM | POA: Diagnosis not present

## 2015-10-31 DIAGNOSIS — I10 Essential (primary) hypertension: Secondary | ICD-10-CM | POA: Insufficient documentation

## 2015-10-31 DIAGNOSIS — K219 Gastro-esophageal reflux disease without esophagitis: Secondary | ICD-10-CM | POA: Insufficient documentation

## 2015-10-31 DIAGNOSIS — S3992XA Unspecified injury of lower back, initial encounter: Secondary | ICD-10-CM | POA: Diagnosis not present

## 2015-10-31 DIAGNOSIS — Y9389 Activity, other specified: Secondary | ICD-10-CM | POA: Diagnosis not present

## 2015-10-31 DIAGNOSIS — S199XXA Unspecified injury of neck, initial encounter: Secondary | ICD-10-CM | POA: Insufficient documentation

## 2015-10-31 DIAGNOSIS — Z79899 Other long term (current) drug therapy: Secondary | ICD-10-CM | POA: Insufficient documentation

## 2015-10-31 DIAGNOSIS — M549 Dorsalgia, unspecified: Secondary | ICD-10-CM

## 2015-10-31 MED ORDER — ONDANSETRON 8 MG PO TBDP
8.0000 mg | ORAL_TABLET | Freq: Once | ORAL | Status: AC
  Administered 2015-10-31: 8 mg via ORAL
  Filled 2015-10-31: qty 1

## 2015-10-31 MED ORDER — HYDROCODONE-ACETAMINOPHEN 5-325 MG PO TABS
1.0000 | ORAL_TABLET | Freq: Once | ORAL | Status: AC
  Administered 2015-10-31: 1 via ORAL
  Filled 2015-10-31: qty 1

## 2015-10-31 MED ORDER — CYCLOBENZAPRINE HCL 10 MG PO TABS: 10.0000 mg | ORAL_TABLET | Freq: Two times a day (BID) | ORAL | Status: DC | PRN

## 2015-10-31 MED ORDER — IBUPROFEN 800 MG PO TABS: 800.0000 mg | ORAL_TABLET | Freq: Three times a day (TID) | ORAL | Status: DC

## 2015-10-31 NOTE — Discharge Instructions (Signed)

## 2015-10-31 NOTE — ED Notes (Signed)
Pt reports she was adjusting her office chair when the back fell off, causing her to fall backwards and hit her back and head on a concrete floor; pt denies LOC; pt states her hair bun "took the brunt of the fall, but I can still feel it"; pt ambulatory and A&O.

## 2015-10-31 NOTE — ED Provider Notes (Signed)
CSN: [id number]     Arrival date & time 10/31/15  4098 History   First MD Initiated Contact with Patient 10/31/15 [phone]     Chief Complaint  Patient presents with  . Back Injury  . Head Injury     (Consider location/radiation/quality/duration/timing/severity/associated sxs/prior Treatment) HPI Comments: Patient presents after fall occurring while at work. She was sitting on a chair that fell apart when she leaned back, causing her to fall, landing on her lower back and then hitting her head on the floor. She complains of pain in the lower back, neck, upper back and head. No chest or abdominal pain. She did not lose consciousness. She has mild nausea but no vomiting. She has been up walking without difficulty.  The history is provided by the patient. No language interpreter was used.    Past Medical History  Diagnosis Date  . Hypertension   . HTN (hypertension) 12/19/2011  . GERD (gastroesophageal reflux disease)    Past Surgical History  Procedure Laterality Date  . Abdominal hysterectomy  05/14/2006    partial, still has ovaries   Family History  Problem Relation Age of Onset  . Diabetes Mother   . Hypertension Mother   . Heart disease Mother   . Dementia Mother   . Hypertension Father   . Kidney disease Father   . Heart disease Father   . Diabetes Father    Social History  Substance Use Topics  . Smoking status: Former Smoker    Quit date: 07/11/1990  . Smokeless tobacco: Never Used  . Alcohol Use: No   OB History    No data available     Review of Systems  Constitutional: Negative for fever.  Respiratory: Negative for shortness of breath.   Cardiovascular: Negative for chest pain.  Gastrointestinal: Positive for nausea. Negative for vomiting and abdominal pain.  Musculoskeletal: Positive for back pain and neck pain.  Skin: Negative for wound.  Neurological: Positive for headaches. Negative for syncope.      Allergies  Diovan hct  Home Medications    Prior to Admission medications   Medication Sig Start Date End Date Taking? Authorizing Provider  chlorthalidone (HYGROTON) 25 MG tablet Take 1 tablet (25 mg total) by mouth daily. 03/22/15   Sheliah Hatch, MD  Cholecalciferol (VITAMIN D) 2000 UNITS tablet Take 2,000 Units by mouth daily.    Historical Provider, MD  cyclobenzaprine (FLEXERIL) 10 MG tablet Take 1 tablet (10 mg total) by mouth 3 (three) times daily as needed for muscle spasms. 01/16/15   Sheliah Hatch, MD  ferrous sulfate 325 (65 FE) MG tablet Take 1 tablet (325 mg total) by mouth daily with breakfast. Patient not taking: Reported on 01/15/2015 08/21/13   Sheliah Hatch, MD  meloxicam (MOBIC) 15 MG tablet Take 1 tablet (15 mg total) by mouth daily. Patient not taking: Reported on 03/22/2015 01/16/15   Sheliah Hatch, MD  omeprazole (PRILOSEC) 40 MG capsule Take 1 capsule (40 mg total) by mouth daily. 04/05/15   Sheliah Hatch, MD  potassium chloride SA (K-DUR,KLOR-CON) 20 MEQ tablet Take 1 tablet (20 mEq total) by mouth daily. 03/22/15   Sheliah Hatch, MD   BP 154/78 mmHg  Pulse 75  Temp(Src) 98.1 F (36.7 C) (Oral)  Resp 20  Ht 5' (1.524 m)  Wt 70.308 kg  BMI 30.27 kg/m2  SpO2 98% Physical Exam  Constitutional: She is oriented to person, place, and time. She appears well-developed and well-nourished. No distress.  HENT:  Head: Normocephalic and atraumatic.  Eyes: Pupils are equal, round, and reactive to light.  Neck: Normal range of motion. Neck supple.  Cardiovascular: Normal rate.   Pulmonary/Chest: Effort normal. She exhibits no tenderness.  Abdominal: Soft. There is no tenderness.  Musculoskeletal: Normal range of motion.  No midline cervical tenderness. There is mild right paracervical tenderness. FROM UE's and LE's. Full strength without lateralizing or focal weakness. Moderate midline and paralumbar tenderness.   Neurological: She is alert and oriented to person, place, and time.    ED  Course  Procedures (including critical care time) Labs Review Labs Reviewed - No data to display  Imaging Review No results found. I have personally reviewed and evaluated these images and lab results as part of my medical decision-making.   EKG Interpretation None     Dg Cervical Spine Complete  10/31/2015  CLINICAL DATA:  Fall from chair with posterior neck pain. Initial encounter. EXAM: CERVICAL SPINE - COMPLETE 4+ VIEW COMPARISON:  None. FINDINGS: The craniocervical junction is partially obscured but is normally aligned based on swimmer's view. There is no evidence of fracture or subluxation. No prevertebral thickening. C5-6 and C6-7 spondylosis. Bilateral cervical facet arthropathy with moderate spurring at C3-4 and C4-5, but no osseous foraminal stenosis. IMPRESSION: No evidence of cervical spine injury. Electronically Signed   By: Marnee Spring M.D.   On: 10/31/2015 05:49   Dg Lumbar Spine Complete  10/31/2015  CLINICAL DATA:  Fall from chair.  Initial encounter. EXAM: LUMBAR SPINE - COMPLETE 4+ VIEW COMPARISON:  None. FINDINGS: No evidence of fracture or subluxation. No focal or notable endplate degeneration or advanced disc narrowing. No endplate erosion or focal bone lesion. Surgical changes in the deep central pelvis, possibly for continence. IMPRESSION: Negative. Electronically Signed   By: Marnee Spring M.D.   On: 10/31/2015 05:51    MDM   Final diagnoses:  None    1. Fall 2. Back pain  The patient presents after fall at work. She is neurologically intact. Negative imaging for fracture. She can be discharged home with supportive care. PCP follow up as needed.    Elpidio Anis, PA-C 10/31/15 0600  April Palumbo, MD 10/31/15 [phone]

## 2015-10-31 NOTE — ED Notes (Signed)
Pt transported to XRay 

## 2015-10-31 NOTE — ED Notes (Signed)
Pt not sure if she needed a drug screen or not and told the staff that we could get one if we needed one, charge RN told pt we do not typically just order drug screens if not needed.

## 2015-10-31 NOTE — ED Notes (Signed)
PA at bedside.

## 2015-12-24 ENCOUNTER — Telehealth: Payer: Self-pay | Admitting: Family Medicine

## 2015-12-24 NOTE — Telephone Encounter (Signed)
Please call and follow up with this patient.   

## 2015-12-24 NOTE — Telephone Encounter (Signed)
Patient Name: Loretta Greene  DOB: 1970/11/29    Initial Comment Caller states having resp issues, cough, flu like symptoms, nose bleed this morning   Nurse Assessment      Guidelines    Guideline Title Affirmed Question Affirmed Notes       Final Disposition User   FINAL ATTEMPT MADE - message left

## 2015-12-25 NOTE — Telephone Encounter (Signed)
Attempted to reach patient regarding the below symptoms. The number provided is non-working.

## 2016-01-20 ENCOUNTER — Ambulatory Visit: Payer: Managed Care, Other (non HMO) | Admitting: Family Medicine

## 2016-01-20 DIAGNOSIS — Z0289 Encounter for other administrative examinations: Secondary | ICD-10-CM

## 2016-01-21 ENCOUNTER — Encounter: Payer: Self-pay | Admitting: Family Medicine

## 2016-01-21 ENCOUNTER — Telehealth: Payer: Self-pay | Admitting: Family Medicine

## 2016-01-21 NOTE — Telephone Encounter (Signed)
Yes- please charge 

## 2016-01-21 NOTE — Telephone Encounter (Signed)
Pt was no show 01/20/16 8:30am for acute appt, pt has not rescheduled, 2nd no show w/in 12 months, charge or no charge?

## 2016-01-21 NOTE — Telephone Encounter (Signed)
Marked to charge and mailing no show letter °

## 2016-01-23 ENCOUNTER — Ambulatory Visit (INDEPENDENT_AMBULATORY_CARE_PROVIDER_SITE_OTHER): Payer: Managed Care, Other (non HMO) | Admitting: Physician Assistant

## 2016-01-23 VITALS — BP 138/76 | HR 68 | Temp 98.1°F | Resp 18 | Ht 61.0 in | Wt 165.0 lb

## 2016-01-23 DIAGNOSIS — R5383 Other fatigue: Secondary | ICD-10-CM

## 2016-01-23 LAB — POCT GLYCOSYLATED HEMOGLOBIN (HGB A1C): HEMOGLOBIN A1C: 5.8

## 2016-01-23 LAB — POCT URINALYSIS DIP (MANUAL ENTRY)
Bilirubin, UA: NEGATIVE
Glucose, UA: NEGATIVE
Ketones, POC UA: NEGATIVE
LEUKOCYTES UA: NEGATIVE
Nitrite, UA: NEGATIVE
PH UA: 5.5
PROTEIN UA: NEGATIVE
SPEC GRAV UA: 1.025
UROBILINOGEN UA: 0.2

## 2016-01-23 LAB — POCT CBC
Granulocyte percent: 52.7 %G (ref 37–80)
HCT, POC: 33.3 % — AB (ref 37.7–47.9)
Hemoglobin: 11.2 g/dL — AB (ref 12.2–16.2)
Lymph, poc: 2.5 (ref 0.6–3.4)
MCH, POC: 28 pg (ref 27–31.2)
MCHC: 33.8 g/dL (ref 31.8–35.4)
MCV: 82.9 fL (ref 80–97)
MID (CBC): 0.3 (ref 0–0.9)
MPV: 7.3 fL (ref 0–99.8)
PLATELET COUNT, POC: 329 10*3/uL (ref 142–424)
POC Granulocyte: 3.2 (ref 2–6.9)
POC LYMPH %: 41.9 % (ref 10–50)
POC MID %: 5.4 %M (ref 0–12)
RBC: 4.01 M/uL — AB (ref 4.04–5.48)
RDW, POC: 13.3 %
WBC: 6 10*3/uL (ref 4.6–10.2)

## 2016-01-23 LAB — TSH: TSH: 1.05 m[IU]/L

## 2016-01-23 NOTE — Progress Notes (Signed)
01/24/2016 11:13 AM   DOB: Feb 02, 1971 / MRN: [id number]  SUBJECTIVE:  Loretta Greene is a 45 y.o. pleasant and well groomed female presenting for fatigue.  Reports that she works an 80 hours work week and has been doing this for the last 27 years. She states she sleeps 4-5 hours nightly.  She is seen by Dr. Beverely Low and reports she sees her regularly.  She has a history of HTN and is taking chlorthalidone, prolosec, k-dur.  She is not taking any other medications chronically.  She denies chest pain, cough, dysuria, and she moves her bowels daily.  She wants to know if she has a medical problem or if she should just stop working some much which she can now afford to do.   She is allergic to diovan hct.   She  has a past medical history of Hypertension; HTN (hypertension) (12/19/2011); and GERD (gastroesophageal reflux disease).    She  reports that she quit smoking about 25 years ago. She has never used smokeless tobacco. She reports that she does not drink alcohol or use illicit drugs. She  reports that she currently engages in sexual activity and has had female partners. She reports using the following method of birth control/protection: Surgical. The patient  has past surgical history that includes Abdominal hysterectomy (05/14/2006).  Her family history includes Dementia in her mother; Diabetes in her father and mother; Heart disease in her father and mother; Hypertension in her father and mother; Kidney disease in her father.  Review of Systems  Constitutional: Negative for fever and chills.  HENT: Negative for sore throat.   Eyes: Negative for blurred vision.  Respiratory: Negative for cough and shortness of breath.   Cardiovascular: Negative for chest pain.  Gastrointestinal: Negative for nausea and abdominal pain.  Genitourinary: Negative for dysuria, urgency and frequency.  Musculoskeletal: Negative for myalgias.  Skin: Negative for rash.  Neurological: Negative for dizziness.    Psychiatric/Behavioral: Negative for depression. The patient is not nervous/anxious.     Problem list and medications reviewed and updated by myself where necessary, and exist elsewhere in the encounter.   OBJECTIVE:  BP 138/76 mmHg  Pulse 68  Temp(Src) 98.1 F (36.7 C) (Oral)  Resp 18  Ht  (1.549 m)  Wt 165 lb (74.844 kg)  BMI 31.19 kg/m2  SpO2 96%  Physical Exam  Constitutional: She is oriented to person, place, and time. She appears well-nourished. No distress.  Eyes: EOM are normal. Pupils are equal, round, and reactive to light.  Cardiovascular: Normal rate and regular rhythm.   Pulmonary/Chest: Effort normal.  Abdominal: She exhibits no distension.  Neurological: She is alert and oriented to person, place, and time. No cranial nerve deficit. Gait normal.  Skin: Skin is dry. She is not diaphoretic.  Psychiatric: She has a normal mood and affect.  Vitals reviewed.   Results for orders placed or performed in visit on 01/23/16 (from the past 72 hour(s))  TSH     Status: None   Collection Time: 01/23/16  3:12 PM  Result Value Ref Range   TSH 1.05 mIU/L    Comment:   Reference Range   > or = 20 Years  0.40-4.50   Pregnancy Range First trimester  0.26-2.66 Second trimester 0.55-2.73 Third trimester  0.43-2.91     POCT CBC     Status: Abnormal   Collection Time: 01/23/16  3:24 PM  Result Value Ref Range   WBC 6.0 4.6 - 10.2 K/uL  Lymph, poc 2.5 0.6 - 3.4   POC LYMPH PERCENT 41.9 10 - 50 %L   MID (cbc) 0.3 0 - 0.9   POC MID % 5.4 0 - 12 %M   POC Granulocyte 3.2 2 - 6.9   Granulocyte percent 52.7 37 - 80 %G   RBC 4.01 (A) 4.04 - 5.48 M/uL   Hemoglobin 11.2 (A) 12.2 - 16.2 g/dL   HCT, POC 16.133.3 (A) 09.637.7 - 47.9 %   MCV 82.9 80 - 97 fL   MCH, POC 28.0 27 - 31.2 pg   MCHC 33.8 31.8 - 35.4 g/dL   RDW, POC 04.513.3 %   Platelet Count, POC 329 142 - 424 K/uL   MPV 7.3 0 - 99.8 fL  POCT glycosylated hemoglobin (Hb A1C)     Status: None   Collection Time:  01/23/16  3:27 PM  Result Value Ref Range   Hemoglobin A1C 5.8   POCT urinalysis dipstick     Status: Abnormal   Collection Time: 01/23/16  3:27 PM  Result Value Ref Range   Color, UA yellow yellow   Clarity, UA clear clear   Glucose, UA negative negative   Bilirubin, UA negative negative   Ketones, POC UA negative negative   Spec Grav, UA 1.025    Blood, UA small (A) negative   pH, UA 5.5    Protein Ur, POC negative negative   Urobilinogen, UA 0.2    Nitrite, UA Negative Negative   Leukocytes, UA Negative Negative    No results found.  ASSESSMENT AND PLAN  Veryl Speaktta was seen today for headache and dizziness.  Diagnoses and all orders for this visit:  Other fatigue: Her work up is largely normal here. She is mildly dehydrated which could be contributing to her complaints of HA and overall fatigue.  However, it is hard to ignore the fact that she sleeps very little and works long hours.  Advised that she scale back her 80 hour work week if possible. I do think she came here today looking for a reason to stop working so much, as she reports she can easily afford to quit and says "I've just always worked hard." I have advised that she stay with her PCP Dr. Beverely Lowabori despite the long drive.  -     POCT glycosylated hemoglobin (Hb A1C) -     POCT CBC -     TSH -     POCT urinalysis dipstick    The patient was advised to call or return to clinic if she does not see an improvement in symptoms or to seek the care of the closest emergency department if she worsens with the above plan.   Deliah BostonMichael Tristin Gladman, MHS, PA-C [facility] 01/24/2016 11:13 AM

## 2016-01-23 NOTE — Patient Instructions (Signed)
     IF you received an x-ray today, you will receive an invoice from [facility]. Please contact [facility] at [phone] with questions or concerns regarding your invoice.   IF you received labwork today, you will receive an invoice from [facility]. Please contact [facility] at [phone] with questions or concerns regarding your invoice.   Our billing staff will not be able to assist you with questions regarding bills from these companies.  You will be contacted with the lab results as soon as they are available. The fastest way to get your results is to activate your My Chart account. Instructions are located on the last page of this paperwork. If you have not heard from us regarding the results in 2 weeks, please contact this office.      

## 2016-06-26 ENCOUNTER — Other Ambulatory Visit: Payer: Self-pay | Admitting: Family Medicine

## 2016-06-26 DIAGNOSIS — I10 Essential (primary) hypertension: Secondary | ICD-10-CM

## 2016-07-17 ENCOUNTER — Ambulatory Visit (INDEPENDENT_AMBULATORY_CARE_PROVIDER_SITE_OTHER): Payer: Managed Care, Other (non HMO) | Admitting: Family Medicine

## 2016-07-17 ENCOUNTER — Encounter: Payer: Self-pay | Admitting: Family Medicine

## 2016-07-17 VITALS — BP 140/88 | HR 90 | Temp 98.1°F | Resp 16 | Ht 61.0 in | Wt 168.5 lb

## 2016-07-17 DIAGNOSIS — R300 Dysuria: Secondary | ICD-10-CM

## 2016-07-17 DIAGNOSIS — R319 Hematuria, unspecified: Secondary | ICD-10-CM | POA: Diagnosis not present

## 2016-07-17 DIAGNOSIS — I10 Essential (primary) hypertension: Secondary | ICD-10-CM | POA: Diagnosis not present

## 2016-07-17 DIAGNOSIS — E669 Obesity, unspecified: Secondary | ICD-10-CM | POA: Diagnosis not present

## 2016-07-17 LAB — HEPATIC FUNCTION PANEL
ALT: 10 U/L (ref 0–35)
AST: 13 U/L (ref 0–37)
Albumin: 3.8 g/dL (ref 3.5–5.2)
Alkaline Phosphatase: 42 U/L (ref 39–117)
BILIRUBIN TOTAL: 0.4 mg/dL (ref 0.2–1.2)
Bilirubin, Direct: 0.1 mg/dL (ref 0.0–0.3)
Total Protein: 6.9 g/dL (ref 6.0–8.3)

## 2016-07-17 LAB — CBC WITH DIFFERENTIAL/PLATELET
BASOS PCT: 0.6 % (ref 0.0–3.0)
Basophils Absolute: 0 10*3/uL (ref 0.0–0.1)
EOS PCT: 2.3 % (ref 0.0–5.0)
Eosinophils Absolute: 0.1 10*3/uL (ref 0.0–0.7)
HEMATOCRIT: 32.6 % — AB (ref 36.0–46.0)
HEMOGLOBIN: 10.9 g/dL — AB (ref 12.0–15.0)
LYMPHS PCT: 35.6 % (ref 12.0–46.0)
Lymphs Abs: 2 10*3/uL (ref 0.7–4.0)
MCHC: 33.5 g/dL (ref 30.0–36.0)
MCV: 84.2 fl (ref 78.0–100.0)
Monocytes Absolute: 0.4 10*3/uL (ref 0.1–1.0)
Monocytes Relative: 7.1 % (ref 3.0–12.0)
Neutro Abs: 3.1 10*3/uL (ref 1.4–7.7)
Neutrophils Relative %: 54.4 % (ref 43.0–77.0)
Platelets: 354 10*3/uL (ref 150.0–400.0)
RBC: 3.87 Mil/uL (ref 3.87–5.11)
RDW: 13.1 % (ref 11.5–15.5)
WBC: 5.7 10*3/uL (ref 4.0–10.5)

## 2016-07-17 LAB — BASIC METABOLIC PANEL
BUN: 10 mg/dL (ref 6–23)
CALCIUM: 9 mg/dL (ref 8.4–10.5)
CO2: 31 mEq/L (ref 19–32)
CREATININE: 0.62 mg/dL (ref 0.40–1.20)
Chloride: 105 mEq/L (ref 96–112)
GFR: 132.88 mL/min (ref >60.00)
Glucose, Bld: 104 mg/dL — ABNORMAL HIGH (ref 70–99)
Potassium: 3.8 mEq/L (ref 3.5–5.1)
Sodium: 140 mEq/L (ref 135–145)

## 2016-07-17 LAB — TSH: TSH: 1.7 u[IU]/mL (ref 0.35–4.50)

## 2016-07-17 LAB — POCT URINALYSIS DIPSTICK
Bilirubin, UA: NEGATIVE
GLUCOSE UA: NEGATIVE
KETONES UA: NEGATIVE
Leukocytes, UA: NEGATIVE
Nitrite, UA: NEGATIVE
Protein, UA: NEGATIVE
SPEC GRAV UA: 1.025
Urobilinogen, UA: 0.2
pH, UA: 6

## 2016-07-17 LAB — LIPID PANEL
CHOL/HDL RATIO: 4
Cholesterol: 155 mg/dL (ref 0–200)
HDL: 38.9 mg/dL — AB (ref >39.00)
LDL Cholesterol: 92 mg/dL (ref 0–99)
NONHDL: 116.19
Triglycerides: 119 mg/dL (ref 0.0–149.0)
VLDL: 23.8 mg/dL (ref 0.0–40.0)

## 2016-07-17 MED ORDER — AMLODIPINE BESYLATE 5 MG PO TABS: 5.0000 mg | ORAL_TABLET | Freq: Every day | ORAL | 3 refills | Status: DC

## 2016-07-17 MED ORDER — CEPHALEXIN 500 MG PO CAPS: 500.0000 mg | ORAL_CAPSULE | Freq: Two times a day (BID) | ORAL | 0 refills | Status: AC

## 2016-07-17 NOTE — Assessment & Plan Note (Signed)
Ongoing issue for pt.  She continues to gain weight b/c she does not have time to eat right or exercise with her 2 jobs.  Stressed the need for both to improve her overall health and wellbeing.  Check labs.  Will follow.

## 2016-07-17 NOTE — Patient Instructions (Signed)
Follow up in 3-4 weeks to recheck BP We'll notify you of your lab results and make any changes if needed Add the Amlodipine once daily to improve the blood pressure Continue to drink plenty of fluids Take the Keflex as directed for presumed UTI Try and rest when you are able! Continue to work on healthy diet and regular exercise- you can do it! Call with any questions or concerns Hang in there!!!

## 2016-07-17 NOTE — Progress Notes (Signed)
   Subjective:    Patient ID: Loretta Greene, female    DOB: January 25, 1971, [age] y.o.   MRN: [id number]  HPI HTN- chronic problem.  BP is elevated again today.  Pt has given up energy drinks but continues to work 2 jobs and only gets 4 hrs of sleep nightly.  No CP, mild SOB w/ stairs.  + HAs.  No visual changes.  Some intermittent lightheadedness.  No edema.  ? UTI- sxs started last week w/ dysuria.  Then developed LBP.  + frequency.  + urgency.  No fevers.  + sensation of incomplete emptying.     Review of Systems For ROS see HPI     Objective:   Physical Exam  Constitutional: She is oriented to person, place, and time. She appears well-developed and well-nourished. No distress.  HENT:  Head: Normocephalic and atraumatic.  Eyes: Conjunctivae and EOM are normal. Pupils are equal, round, and reactive to light.  Neck: Normal range of motion. Neck supple. No thyromegaly present.  Cardiovascular: Normal rate, regular rhythm, normal heart sounds and intact distal pulses.   No murmur heard. Pulmonary/Chest: Effort normal and breath sounds normal. No respiratory distress.  Abdominal: Soft. She exhibits no distension. There is no tenderness.  Musculoskeletal: She exhibits no edema.  Lymphadenopathy:    She has no cervical adenopathy.  Neurological: She is alert and oriented to person, place, and time.  Skin: Skin is warm and dry.  Psychiatric: She has a normal mood and affect. Her behavior is normal.  Vitals reviewed.         Assessment & Plan:

## 2016-07-17 NOTE — Progress Notes (Signed)
Pre visit review using our clinic review tool, if applicable. No additional management support is needed unless otherwise documented below in the visit note. 

## 2016-07-17 NOTE — Addendum Note (Signed)
Addended by: Geannie RisenBRODMERKEL, Aedan Geimer L on: 07/17/2016 02:49 PM   Modules accepted: Orders

## 2016-07-17 NOTE — Assessment & Plan Note (Signed)
Chronic problem.  BP is not at goal today.  Due to hx of tongue swelling w/ Diovan HCTZ, will start Amlodipine in addition to Chlorthalidone.  Check labs.  Will follow closely.

## 2016-07-17 NOTE — Assessment & Plan Note (Signed)
New.  sxs are suspicious for UTI.  Get UA and send for cx if appropriate.  Start Keflex empirically.  Pt expressed understanding and is in agreement w/ plan.

## 2016-07-19 LAB — URINE CULTURE: ORGANISM ID, BACTERIA: NO GROWTH

## 2016-07-20 ENCOUNTER — Encounter: Payer: Self-pay | Admitting: General Practice

## 2016-07-22 ENCOUNTER — Telehealth: Payer: Self-pay | Admitting: Family Medicine

## 2016-07-22 MED ORDER — FLUCONAZOLE 150 MG PO TABS: 150.0000 mg | ORAL_TABLET | Freq: Once | ORAL | 0 refills | Status: AC

## 2016-07-22 NOTE — Telephone Encounter (Signed)
Medication filled to pharmacy as requested.   

## 2016-07-22 NOTE — Telephone Encounter (Signed)
Ok for Diflucan 150mg  x1 tab

## 2016-07-22 NOTE — Telephone Encounter (Signed)
Pt states that the antibiotic she got last Friday has now gave her a yeast infection and asking if [facility] [address], [place] - [id number] [address]

## 2016-07-27 ENCOUNTER — Telehealth: Payer: Self-pay | Admitting: Emergency Medicine

## 2016-07-27 NOTE — Telephone Encounter (Signed)
Her Hgb of 10.9 has been stable for the last ~3 yrs and consistent w/ mild anemia.  This is not unusual for menstruating women.  She can add an OTC iron supplement to improve this number.  There was only trace blood in the urine but since there was no infection, it would be reasonable to refer to Urology for a complete work up.  The mildly elevated sugar is not concerning b/c the blood was collected at 1:51pm and the sugar was only 104.  If she would like to go to urology, please place referral

## 2016-07-27 NOTE — Telephone Encounter (Signed)
Patient called with concerns of her labs results. She states her cbc and hct was low, blood in her urine and elevated blood sugar. She wanted to know if anything she needed to do, does she needs to start vitamins OTC to help build her levels back up.  Please advise

## 2016-07-27 NOTE — Telephone Encounter (Signed)
Left message on machine for annotation below

## 2016-07-27 NOTE — Telephone Encounter (Signed)
Advised patient of hgb has been stable the past 3 yrs. KT recommends she can add an OTC iron supplement to improve numbers, advised the sugars were mildly elevated due to not fasting which she was not, as well the trace of blood in the urine she can have further evaluation thru Urology. Patient states since there was no infection she wanted to wait until her next appointment for recheck if the blood was still present before going thru with Urology referral

## 2016-08-13 ENCOUNTER — Ambulatory Visit: Payer: Self-pay | Admitting: Family Medicine

## 2016-08-25 ENCOUNTER — Other Ambulatory Visit: Payer: Self-pay | Admitting: Family Medicine

## 2016-08-25 ENCOUNTER — Telehealth: Payer: Self-pay | Admitting: Family Medicine

## 2016-08-25 NOTE — Telephone Encounter (Signed)
Patient Name: Loretta Greene DOB: 04-Jun-1971 Initial Comment Caller states she doesn't feel well and has metallic taste in mouth, concerned it may be due to high blood sugar, borderline diabetic Nurse Assessment Nurse: Charna Elizabethrumbull, RN, Lynden Angathy Date/Time (Eastern Time): 08/25/2016 10:06:11 AM Confirm and document reason for call. If symptomatic, describe symptoms. You must click the next button to save text entered. ---Loretta Greene states she was seen in an urgent care this morning and her blood sugar was 102 a few minutes ago. She states she will follow the directions she was given at the urgent care and will call back as needed. She declined further triage evaluation at this time. Encouraged to call back with any concerns or questions. Does the patient have any new or worsening symptoms? ---No Guidelines Guideline Title Affirmed Question Affirmed Notes Final Disposition User Clinical Call Prattrumbull, RN, EMCORCathy

## 2017-02-01 ENCOUNTER — Ambulatory Visit: Payer: Self-pay | Admitting: Family Medicine

## 2017-03-02 IMAGING — CR DG LUMBAR SPINE COMPLETE 4+V
5 series · 5 of 5 positions shown · non-contrast
Comparison: None.

CLINICAL DATA: Fall from chair.  Initial encounter.

EXAM:
LUMBAR SPINE - COMPLETE 4+ VIEW

[t lumbar spine ap]
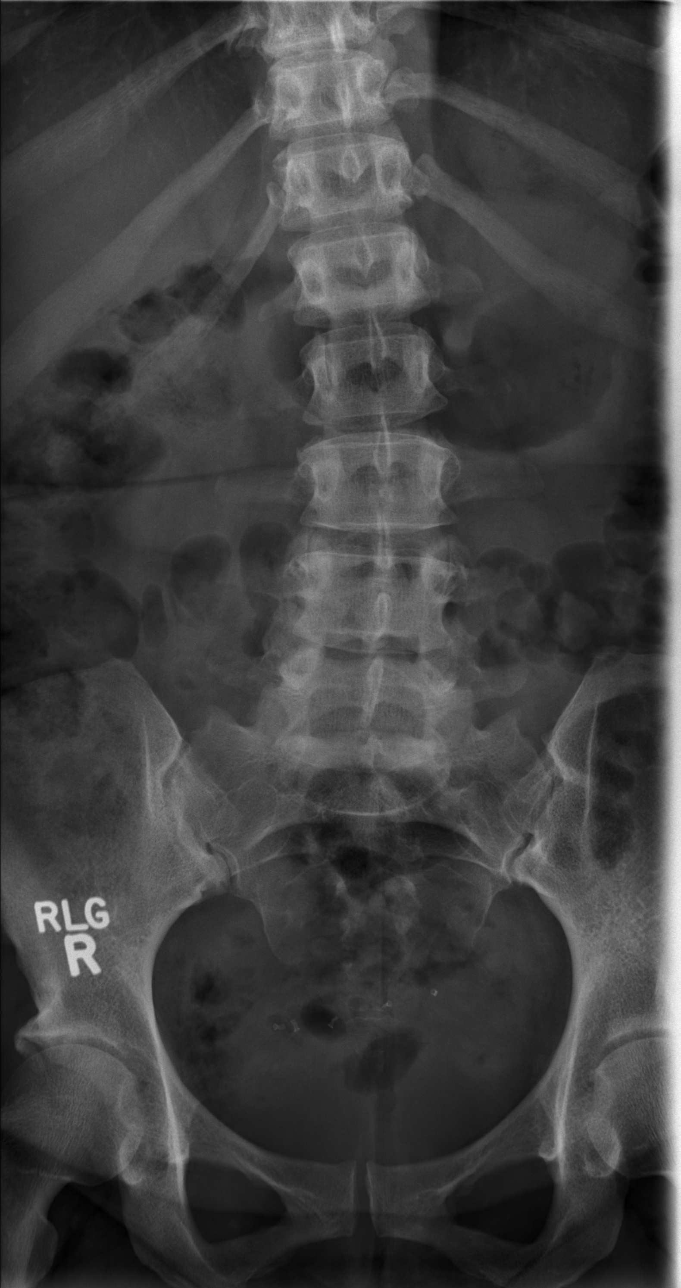

[t lumbar spine obl (1 of 2)]
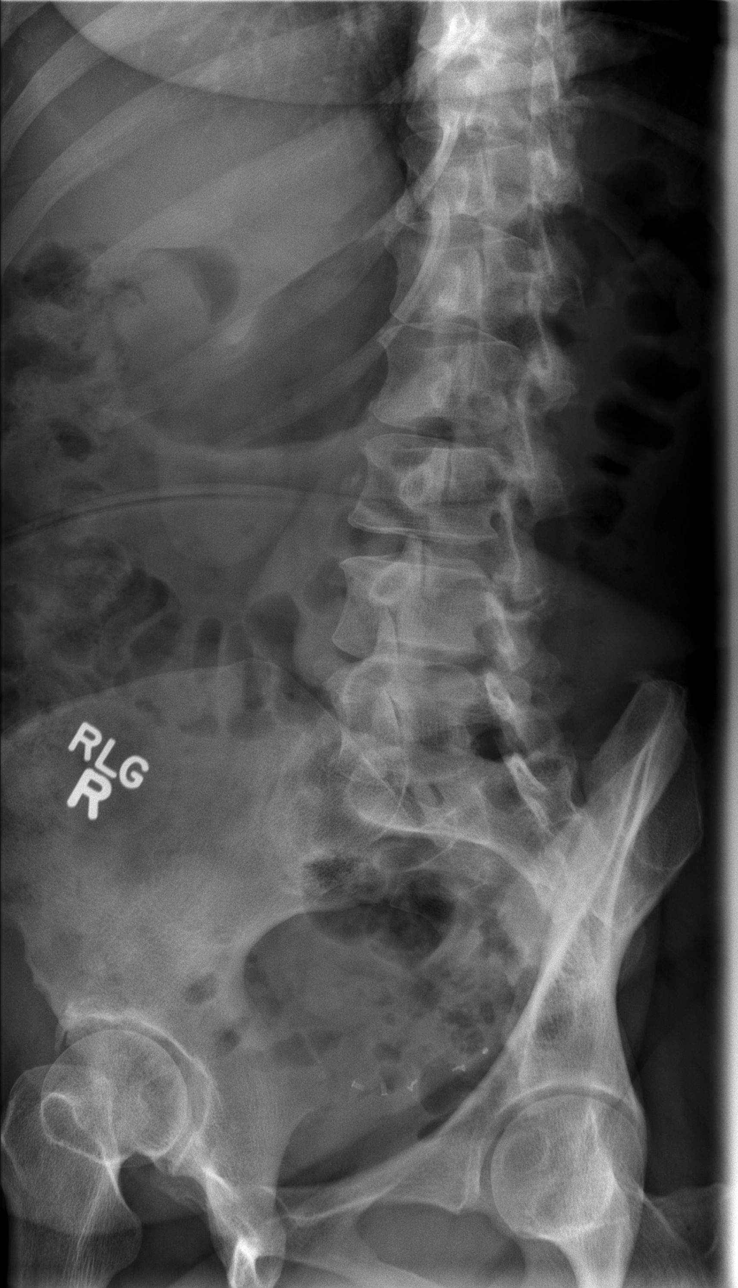

[t lumbar spine obl (2 of 2)]
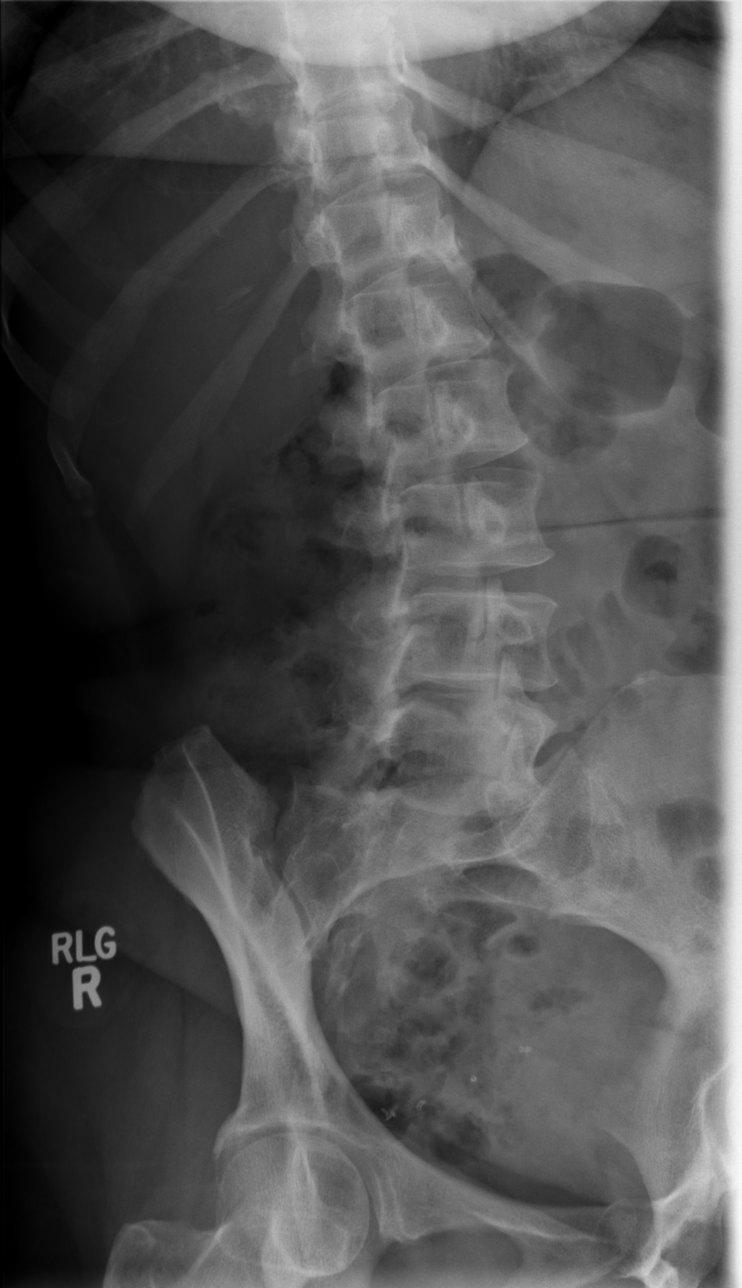

[t lumbar spine lat]
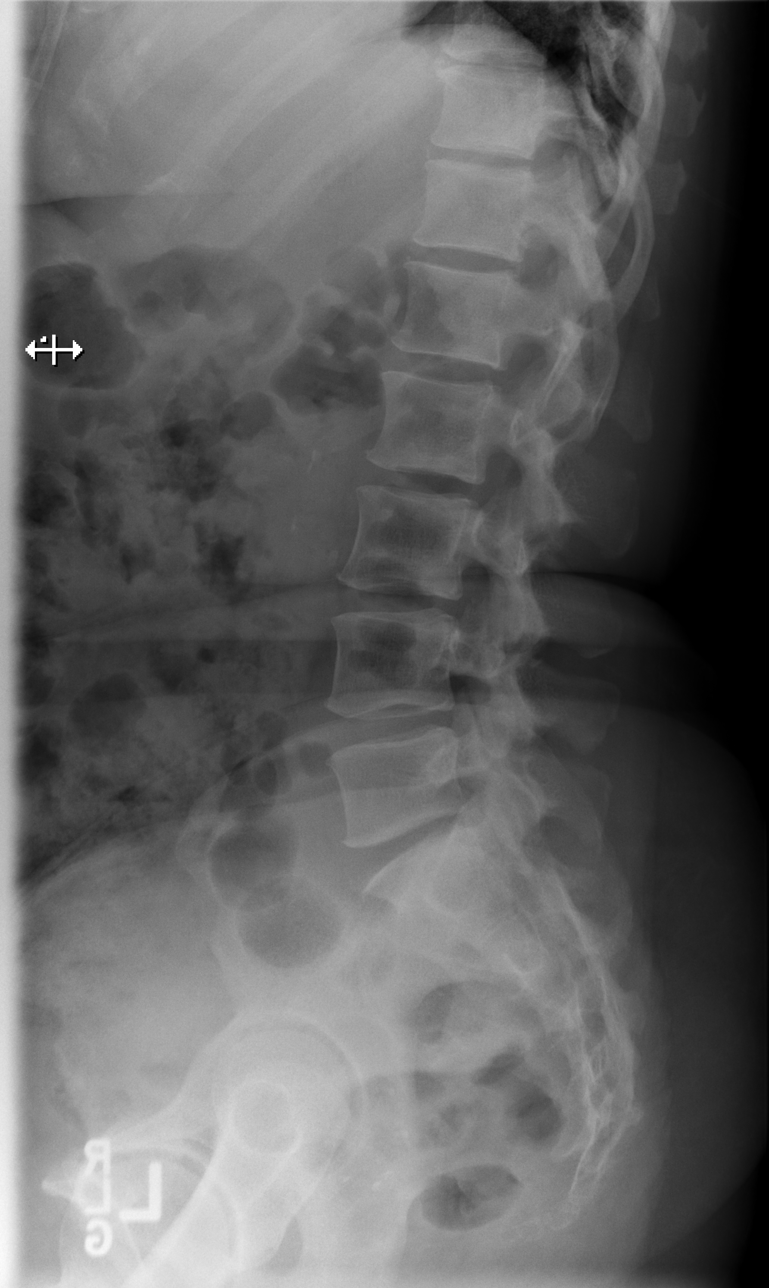

[t lumbar l-5 s-1 spot]
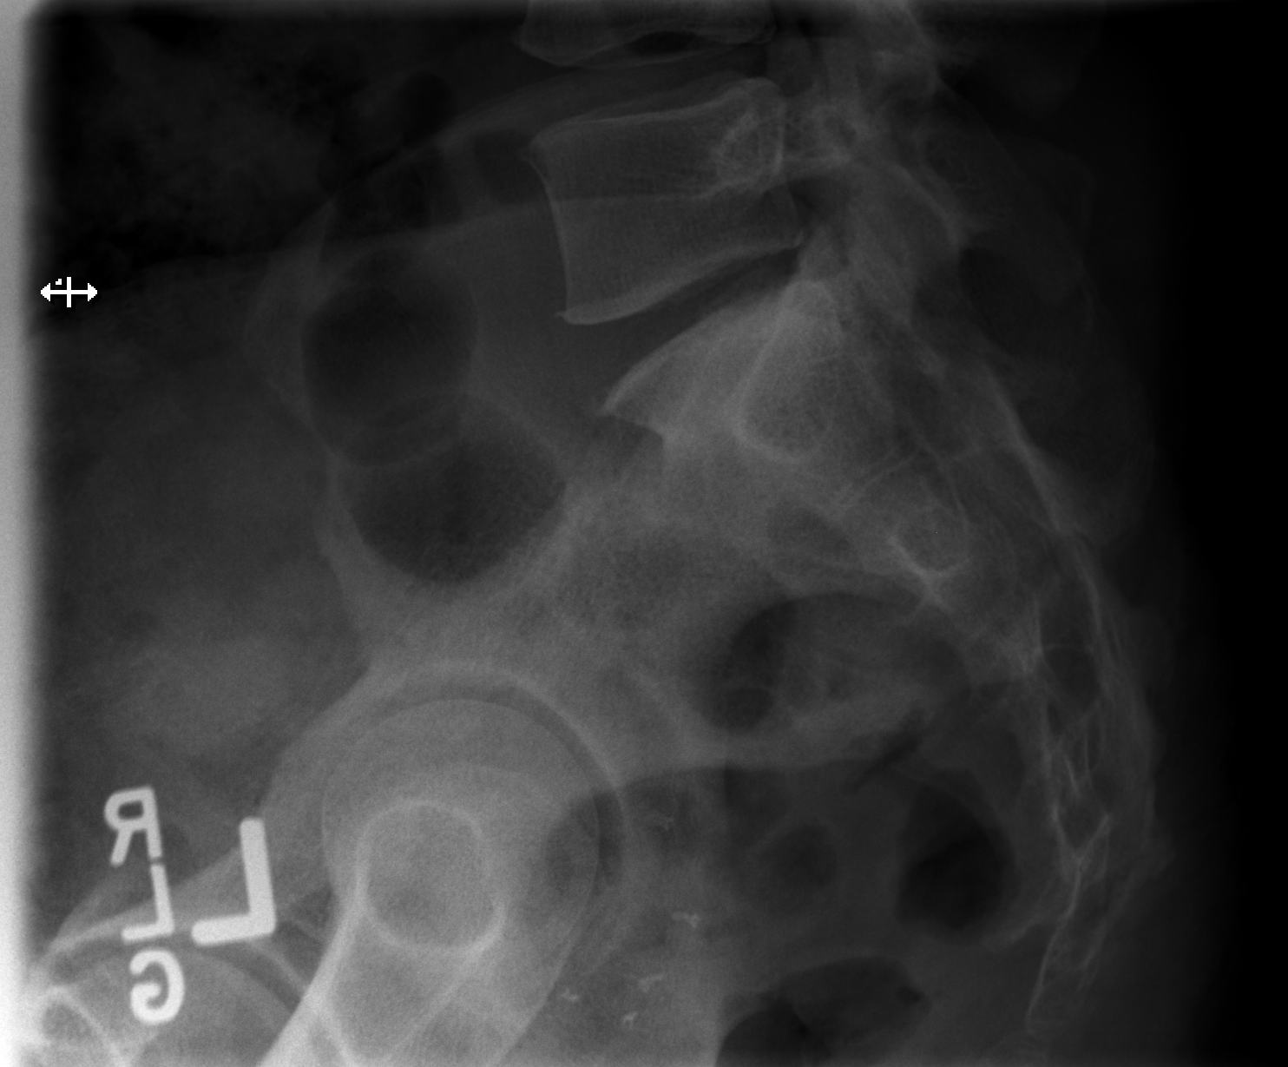

[5 of 5 positions shown; findings below may reference images not displayed]

FINDINGS: No evidence of fracture or subluxation. No focal or notable endplate
degeneration or advanced disc narrowing. No endplate erosion or
focal bone lesion.

Surgical changes in the deep central pelvis, possibly for
continence.
IMPRESSION: Negative.

## 2017-03-02 IMAGING — CR DG CERVICAL SPINE COMPLETE 4+V
6 series · 6 of 6 positions shown · non-contrast
Comparison: None.

CLINICAL DATA: Fall from chair with posterior neck pain. Initial
encounter.

EXAM:
CERVICAL SPINE - COMPLETE 4+ VIEW

[w cervical spine lat]
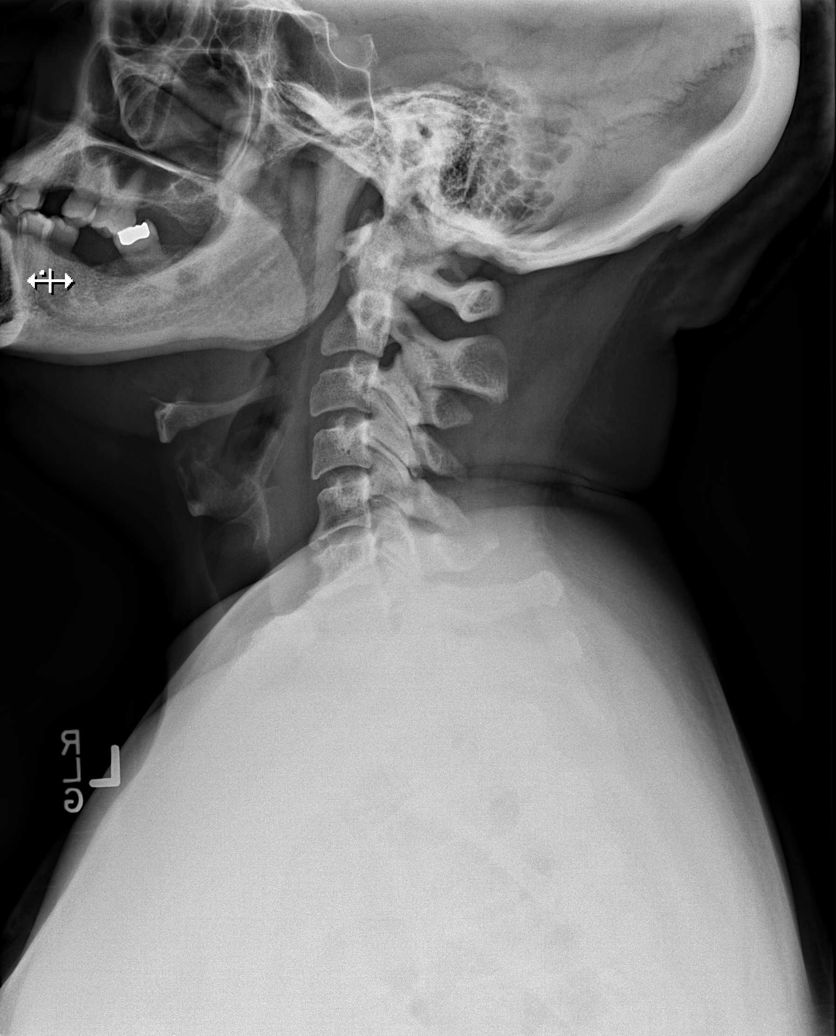

[w cervical spine ap_obl (1 of 2)]
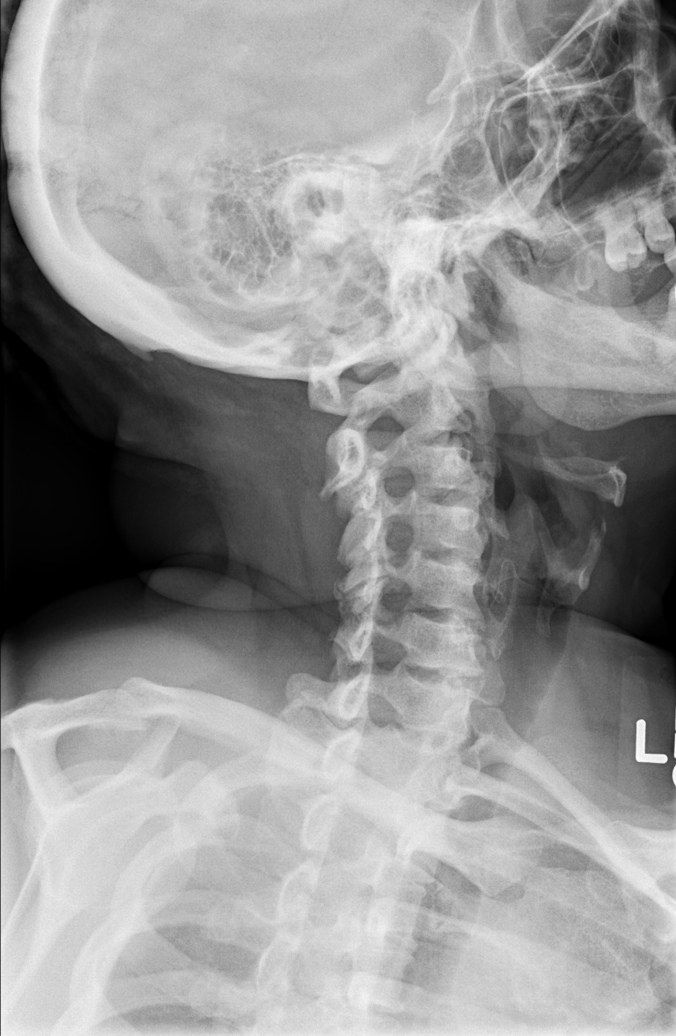

[w cervical spine ap_obl (2 of 2)]
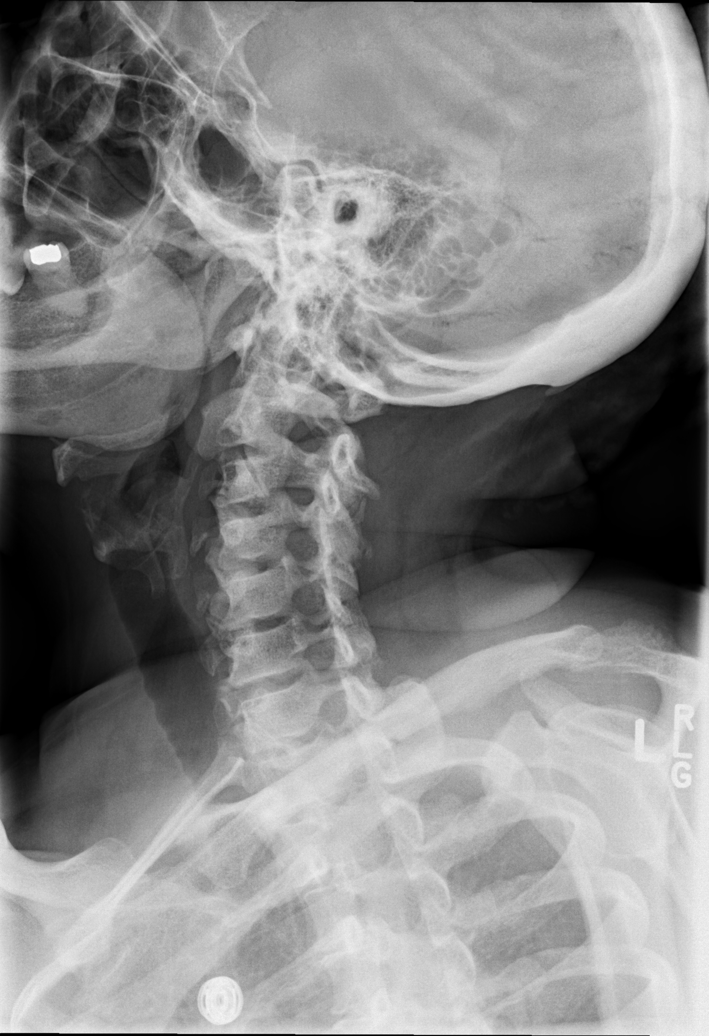

[w cervical spine ap]
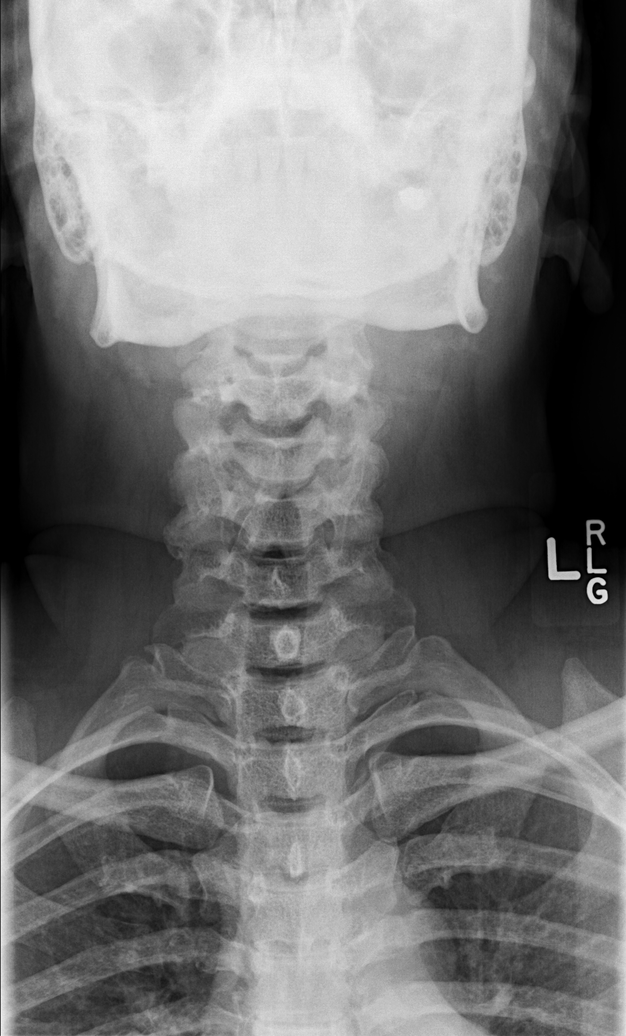

[w cervical spine odontoid]
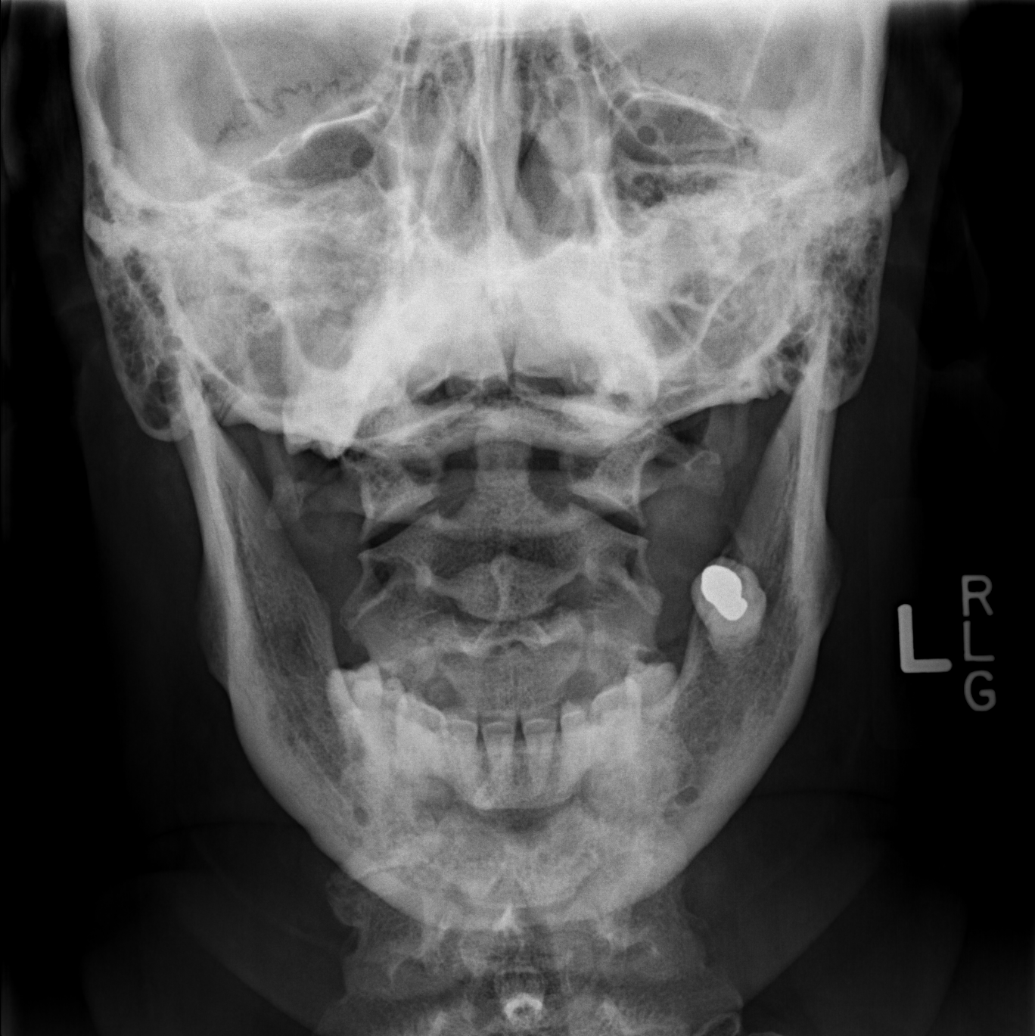

[w cervical swimmers]
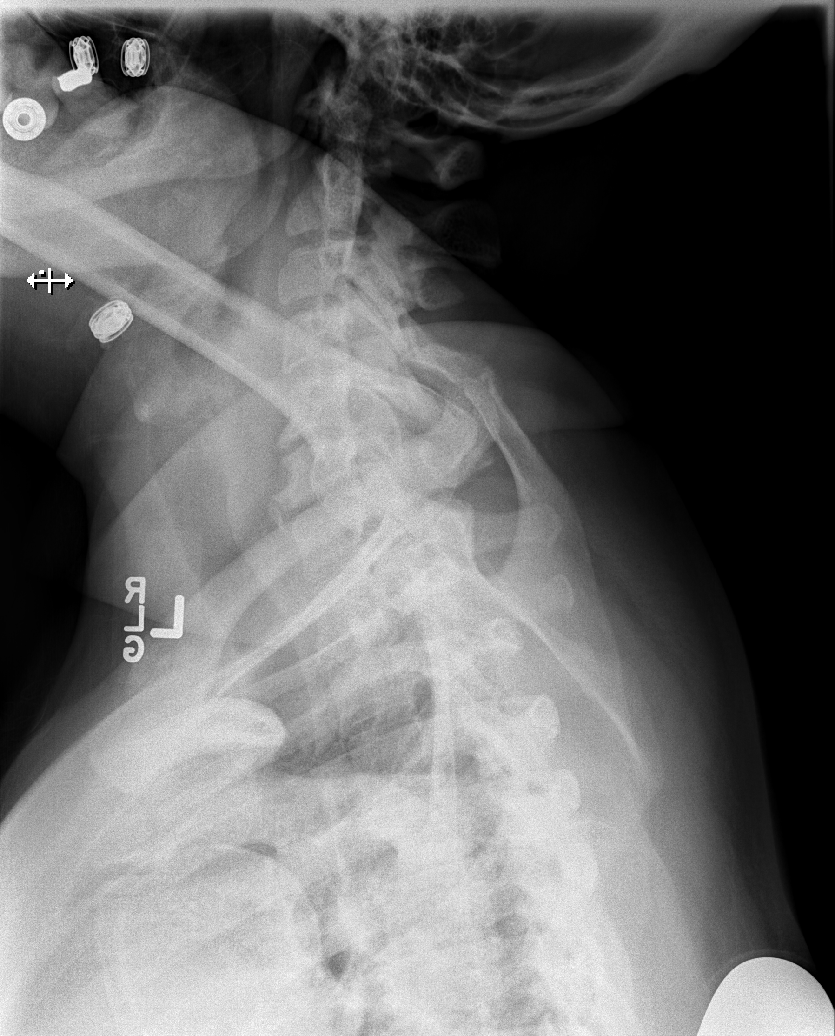

[6 of 6 positions shown; findings below may reference images not displayed]

FINDINGS: The craniocervical junction is partially obscured but is normally
aligned based on swimmer's view.

There is no evidence of fracture or subluxation. No prevertebral
thickening. C5-6 and C6-7 spondylosis. Bilateral cervical facet
arthropathy with moderate spurring at C3-4 and C4-5, but no osseous
foraminal stenosis.
IMPRESSION: No evidence of cervical spine injury.

## 2017-04-05 ENCOUNTER — Encounter: Payer: Self-pay | Admitting: Family Medicine

## 2017-04-05 ENCOUNTER — Ambulatory Visit (INDEPENDENT_AMBULATORY_CARE_PROVIDER_SITE_OTHER): Payer: BLUE CROSS/BLUE SHIELD | Admitting: Family Medicine

## 2017-04-05 VITALS — BP 130/80 | HR 77 | Resp 16 | Ht 61.0 in | Wt 165.4 lb

## 2017-04-05 DIAGNOSIS — R5383 Other fatigue: Secondary | ICD-10-CM | POA: Diagnosis not present

## 2017-04-05 DIAGNOSIS — R8290 Unspecified abnormal findings in urine: Secondary | ICD-10-CM

## 2017-04-05 LAB — BASIC METABOLIC PANEL
BUN: 13 mg/dL (ref 6–23)
CALCIUM: 10.1 mg/dL (ref 8.4–10.5)
CO2: 29 meq/L (ref 19–32)
CREATININE: 0.87 mg/dL (ref 0.40–1.20)
Chloride: 101 mEq/L (ref 96–112)
GFR: 89.61 mL/min (ref >60.00)
Glucose, Bld: 111 mg/dL — ABNORMAL HIGH (ref 70–99)
Potassium: 3.5 mEq/L (ref 3.5–5.1)
SODIUM: 137 meq/L (ref 135–145)

## 2017-04-05 LAB — CBC WITH DIFFERENTIAL/PLATELET
Basophils Absolute: 0.1 10*3/uL (ref 0.0–0.1)
Basophils Relative: 0.8 % (ref 0.0–3.0)
EOS PCT: 1.7 % (ref 0.0–5.0)
Eosinophils Absolute: 0.1 10*3/uL (ref 0.0–0.7)
HEMATOCRIT: 34 % — AB (ref 36.0–46.0)
HEMOGLOBIN: 11.4 g/dL — AB (ref 12.0–15.0)
LYMPHS PCT: 29.7 % (ref 12.0–46.0)
Lymphs Abs: 2.1 10*3/uL (ref 0.7–4.0)
MCHC: 33.6 g/dL (ref 30.0–36.0)
MCV: 83.1 fl (ref 78.0–100.0)
MONOS PCT: 7.1 % (ref 3.0–12.0)
Monocytes Absolute: 0.5 10*3/uL (ref 0.1–1.0)
NEUTROS ABS: 4.3 10*3/uL (ref 1.4–7.7)
Neutrophils Relative %: 60.7 % (ref 43.0–77.0)
PLATELETS: 396 10*3/uL (ref 150.0–400.0)
RBC: 4.09 Mil/uL (ref 3.87–5.11)
RDW: 13.4 % (ref 11.5–15.5)
WBC: 7.1 10*3/uL (ref 4.0–10.5)

## 2017-04-05 LAB — POCT URINALYSIS DIPSTICK
Bilirubin, UA: NEGATIVE
GLUCOSE UA: NEGATIVE
Ketones, UA: NEGATIVE
LEUKOCYTES UA: NEGATIVE
NITRITE UA: NEGATIVE
PH UA: 6 (ref 5.0–8.0)
RBC UA: NEGATIVE
Spec Grav, UA: 1.025 (ref 1.010–1.025)
UROBILINOGEN UA: 0.2 U/dL

## 2017-04-05 LAB — HEPATIC FUNCTION PANEL
ALT: 12 U/L (ref 0–35)
AST: 11 U/L (ref 0–37)
Albumin: 4.2 g/dL (ref 3.5–5.2)
Alkaline Phosphatase: 44 U/L (ref 39–117)
BILIRUBIN DIRECT: 0.1 mg/dL (ref 0.0–0.3)
BILIRUBIN TOTAL: 0.5 mg/dL (ref 0.2–1.2)
Total Protein: 7.6 g/dL (ref 6.0–8.3)

## 2017-04-05 LAB — TSH: TSH: 1.14 u[IU]/mL (ref 0.35–4.50)

## 2017-04-05 LAB — VITAMIN D 25 HYDROXY (VIT D DEFICIENCY, FRACTURES): VITD: 11.96 ng/mL — ABNORMAL LOW (ref 30.00–100.00)

## 2017-04-05 LAB — B12 AND FOLATE PANEL
Folate: 7.3 ng/mL (ref >5.9)
VITAMIN B 12: 275 pg/mL (ref 211–911)

## 2017-04-05 NOTE — Assessment & Plan Note (Signed)
New.  Pt reports a few months of fatigue.  Had similar sxs years ago but those resolved.  Discussed the possibility of menopause- pt is not willing to accept this.  Also discussed possible OSA given her loud snoring.  Check labs to r/o anemia (pt not taking iron), thyroid abnormality, vitamin deficiency.  If labs are unrevealing, will refer for sleep study.  Pt expressed understanding and is in agreement w/ plan.

## 2017-04-05 NOTE — Patient Instructions (Signed)
Schedule your complete physical in 3-4 months We'll notify you of your lab results and make any changes if needed Continue to work on healthy diet and regular exercise- this will also help w/ sleep If labs are normal, we'll refer for a sleep study to rule out sleep apnea This may be menopause- but that will be a diagnosis of exclusion Drink plenty of fluids Call with any questions or concerns Hang in there!!!

## 2017-04-05 NOTE — Progress Notes (Signed)
Pre visit review using our clinic review tool, if applicable. No additional management support is needed unless otherwise documented below in the visit note. 

## 2017-04-05 NOTE — Progress Notes (Signed)
   Subjective:    Patient ID: Loretta Greene, female    DOB: 06-28-1971, 46 y.o.   MRN: [id number]  HPI Fatigue- pt reports she is not sleeping well, will wake up sweaty.  'i ain't going through no change.  I ain't never going through no change.  I'm not going to have that discussion'.  Taking BP pills but not taking iron regularly.  Hx of anemia.  sxs started 'a few months ago'.  Pt reports loud snoring.  Urine odor- sxs started a few weeks ago.  Recently started vinegar pills.  No burning w/ urination.  Limited water intake.  Some increased frequency.   Review of Systems For ROS see HPI     Objective:   Physical Exam  Constitutional: She is oriented to person, place, and time. She appears well-developed and well-nourished. No distress.  HENT:  Head: Normocephalic and atraumatic.  Eyes: Conjunctivae and EOM are normal. Pupils are equal, round, and reactive to light.  Neck: Normal range of motion. Neck supple. No thyromegaly present.  Cardiovascular: Normal rate, regular rhythm, normal heart sounds and intact distal pulses.   No murmur heard. Pulmonary/Chest: Effort normal and breath sounds normal. No respiratory distress.  Abdominal: Soft. She exhibits no distension. There is no tenderness.  Musculoskeletal: She exhibits no edema.  Lymphadenopathy:    She has no cervical adenopathy.  Neurological: She is alert and oriented to person, place, and time.  Skin: Skin is warm and dry.  Psychiatric: She has a normal mood and affect. Her behavior is normal.  Vitals reviewed.         Assessment & Plan:  Abnormal urine odor- new.  Pt started Vinegar capsules around the same time.  Encouraged increased hydration.  Check UA.  Send for culture if abnormal and tx prn.  Pt expressed understanding and is in agreement w/ plan.

## 2017-04-06 ENCOUNTER — Other Ambulatory Visit: Payer: Self-pay | Admitting: General Practice

## 2017-04-06 MED ORDER — VITAMIN D (ERGOCALCIFEROL) 1.25 MG (50000 UNIT) PO CAPS: 50000.0000 [IU] | ORAL_CAPSULE | ORAL | 0 refills | Status: DC

## 2017-04-06 NOTE — Progress Notes (Signed)
Called pt and lmovm to return call.

## 2017-06-14 ENCOUNTER — Other Ambulatory Visit: Payer: Self-pay | Admitting: Family Medicine

## 2017-07-18 ENCOUNTER — Other Ambulatory Visit: Payer: Self-pay | Admitting: Family Medicine

## 2017-07-18 DIAGNOSIS — I10 Essential (primary) hypertension: Secondary | ICD-10-CM

## 2017-07-26 ENCOUNTER — Encounter: Payer: Self-pay | Admitting: Family Medicine

## 2017-09-14 ENCOUNTER — Ambulatory Visit: Payer: Self-pay | Admitting: *Deleted

## 2017-09-14 NOTE — Telephone Encounter (Signed)
Pt asking for home care advice to help treat diarrhea. Pt thinks she had food poisoning from eating a salad on Friday with shrimp in it from [place] [employer] [place]. Pt has been able to tolerate fluids and has not had any episodes of vomiting. Pt advised to call back if current symptoms don't improve with home care recommendations. Pt verbalized understanding.  Reason for Disposition . MILD-MODERATE diarrhea (e.g., 1-6 times / day more than normal)  Answer Assessment - Initial Assessment Questions 1. DIARRHEA SEVERITY: "How bad is the diarrhea?" "How many extra stools have you had in the past 24 hours than normal?"    - MILD: Few loose or mushy BMs; increase of 1-3 stools over normal daily number of stools; mild increase in ostomy output.   - MODERATE: Increase of 4-6 stools daily over normal; moderate increase in ostomy output.   - SEVERE (or Worst Possible): Increase of 7 or more stools daily over normal; moderate increase in ostomy output; incontinence.     5 times/moderate 2. ONSET: "When did the diarrhea begin?"      Saturday evening 3. BM CONSISTENCY: "How loose or watery is the diarrhea?"      Watery 4. VOMITING: "Are you also vomiting?" If so, ask: "How many times in the past 24 hours?"      No 5. ABDOMINAL PAIN: "Are you having any abdominal pain?" If yes: "What does it feel like?" (e.g., crampy, dull, intermittent, constant)      Cramps 6. ABDOMINAL PAIN SEVERITY: If present, ask: "How bad is the pain?"  (e.g., Scale 1-10; mild, moderate, or severe)    - MILD (1-3): doesn't interfere with normal activities, abdomen soft and not tender to touch     - MODERATE (4-7): interferes with normal activities or awakens from sleep, tender to touch     - SEVERE (8-10): excruciating pain, doubled over, unable to do any normal activities       Comes and goes, pt does not have any  7. ORAL INTAKE: If vomiting, "Have you been able to drink liquids?" "How much fluids have you had in the past 24  hours?"     Has been able to hold fluid and crackers down 8. HYDRATION: "Any signs of dehydration?" (e.g., dry mouth [not just dry lips], too weak to stand, dizziness, new weight loss) "When did you last urinate?"     No 9. EXPOSURE: "Have you traveled to a foreign country recently?" "Have you been exposed to anyone with diarrhea?" "Could you have eaten any food that was spoiled?"     Salad with shrimp at [employer] [place] on Friday 10. OTHER SYMPTOMS: "Do you have any other symptoms?" (e.g., fever, blood in stool)       Pt felt like she was burning up, but did not check temp, no blood in stool 11. PREGNANCY: "Is there any chance you are pregnant?" "When was your last menstrual period?"       No  Protocols used: DIARRHEA-A-AH

## 2018-08-01 ENCOUNTER — Other Ambulatory Visit: Payer: Self-pay | Admitting: Family Medicine

## 2018-08-01 DIAGNOSIS — I10 Essential (primary) hypertension: Secondary | ICD-10-CM

## 2018-08-01 NOTE — Telephone Encounter (Signed)
Copied from CRM [id number]. Topic: Quick Communication - Rx Refill/Question >> Aug 01, 2018 10:22 AM Aretta Nip wrote: Medication/chlorthalidone (HYGROTON) 25 MG tablet Pt very upset... Needs two meds, does not know the name of the second one just Blood pressure medicine. Refills denied, has not seen Dr Beverely Low since July 2018, tried to make an appointment but even as Tabori could see her tomorrow she needs refills now but can not get off work, had to get off phone and go back to work, wants enough pills to last her and she will make an appointment when she has time. Not cooperative. [facility] [address], [phone] Her contact number  [phone]

## 2018-08-02 NOTE — Telephone Encounter (Signed)
Patient has not been seen since July 2018.  She would have been out of medications in March of 2019.  This is why she was advised by the [facility] that she needed an appointment.  She has become upset with everyone she has talked with, and hung up on them twice.   Routing to PCP to advise.

## 2018-08-02 NOTE — Telephone Encounter (Signed)
Pt called back to inquire the status of her medication refill. Pt called in very upset, she says that she shouldn't have to wait this long to have her medication refilled. Pt says it's against the law to hold her medication from her until she can make an appointment. She also stated she will go in other directions if this does not get taken care ASAP.  Pt hung up, after advising pt to hold.  Did get permission from the office to schedule appt with another provider at the office, if pt does call back.

## 2018-08-02 NOTE — Telephone Encounter (Signed)
Patient called back and spoke with another agent at the [facility].  She was again told she had to schedule an appointment. Since the CPE's are so far out, agent scheduled her an appointment for 08/05/18 for medication follow-up.    Patient was not happy about having to make the appointment, but she did go ahead and schedule.

## 2018-08-02 NOTE — Telephone Encounter (Signed)
Pt will need to show up for appt prior to filling medications as she has not been seen in almost 18 months.

## 2018-08-03 ENCOUNTER — Other Ambulatory Visit: Payer: Self-pay | Admitting: Family Medicine

## 2018-08-03 DIAGNOSIS — I10 Essential (primary) hypertension: Secondary | ICD-10-CM

## 2018-08-05 ENCOUNTER — Encounter: Payer: Self-pay | Admitting: Family Medicine

## 2018-08-05 ENCOUNTER — Ambulatory Visit: Payer: BLUE CROSS/BLUE SHIELD | Admitting: Family Medicine

## 2018-08-05 ENCOUNTER — Other Ambulatory Visit: Payer: Self-pay

## 2018-08-05 VITALS — BP 138/89 | HR 87 | Temp 98.1°F | Resp 17 | Ht 61.0 in | Wt 163.0 lb

## 2018-08-05 DIAGNOSIS — Z1231 Encounter for screening mammogram for malignant neoplasm of breast: Secondary | ICD-10-CM | POA: Diagnosis not present

## 2018-08-05 DIAGNOSIS — I10 Essential (primary) hypertension: Secondary | ICD-10-CM | POA: Diagnosis not present

## 2018-08-05 DIAGNOSIS — E669 Obesity, unspecified: Secondary | ICD-10-CM | POA: Diagnosis not present

## 2018-08-05 LAB — LIPID PANEL
CHOL/HDL RATIO: 4
Cholesterol: 174 mg/dL (ref 0–200)
HDL: 43.3 mg/dL (ref >39.00)
LDL CALC: 116 mg/dL — AB (ref 0–99)
NONHDL: 130.46
TRIGLYCERIDES: 71 mg/dL (ref 0.0–149.0)
VLDL: 14.2 mg/dL (ref 0.0–40.0)

## 2018-08-05 LAB — BASIC METABOLIC PANEL
BUN: 10 mg/dL (ref 6–23)
CALCIUM: 8.9 mg/dL (ref 8.4–10.5)
CHLORIDE: 105 meq/L (ref 96–112)
CO2: 30 mEq/L (ref 19–32)
CREATININE: 0.6 mg/dL (ref 0.40–1.20)
GFR: 136.81 mL/min (ref >60.00)
Glucose, Bld: 111 mg/dL — ABNORMAL HIGH (ref 70–99)
POTASSIUM: 4.1 meq/L (ref 3.5–5.1)
Sodium: 140 mEq/L (ref 135–145)

## 2018-08-05 LAB — HEPATIC FUNCTION PANEL
ALBUMIN: 4 g/dL (ref 3.5–5.2)
ALT: 11 U/L (ref 0–35)
AST: 11 U/L (ref 0–37)
Alkaline Phosphatase: 38 U/L — ABNORMAL LOW (ref 39–117)
Bilirubin, Direct: 0.1 mg/dL (ref 0.0–0.3)
Total Bilirubin: 0.5 mg/dL (ref 0.2–1.2)
Total Protein: 7.5 g/dL (ref 6.0–8.3)

## 2018-08-05 LAB — CBC WITH DIFFERENTIAL/PLATELET
Basophils Absolute: 0 10*3/uL (ref 0.0–0.1)
Basophils Relative: 0.5 % (ref 0.0–3.0)
EOS PCT: 3.6 % (ref 0.0–5.0)
Eosinophils Absolute: 0.3 10*3/uL (ref 0.0–0.7)
HEMATOCRIT: 33.4 % — AB (ref 36.0–46.0)
HEMOGLOBIN: 11 g/dL — AB (ref 12.0–15.0)
LYMPHS PCT: 20.3 % (ref 12.0–46.0)
Lymphs Abs: 1.5 10*3/uL (ref 0.7–4.0)
MCHC: 33.1 g/dL (ref 30.0–36.0)
MCV: 84 fl (ref 78.0–100.0)
MONO ABS: 0.5 10*3/uL (ref 0.1–1.0)
Monocytes Relative: 7.1 % (ref 3.0–12.0)
Neutro Abs: 5 10*3/uL (ref 1.4–7.7)
Neutrophils Relative %: 68.5 % (ref 43.0–77.0)
Platelets: 377 10*3/uL (ref 150.0–400.0)
RBC: 3.98 Mil/uL (ref 3.87–5.11)
RDW: 13.3 % (ref 11.5–15.5)
WBC: 7.3 10*3/uL (ref 4.0–10.5)

## 2018-08-05 LAB — TSH: TSH: 1.78 u[IU]/mL (ref 0.35–4.50)

## 2018-08-05 MED ORDER — AMLODIPINE BESYLATE 5 MG PO TABS: 5.0000 mg | ORAL_TABLET | Freq: Every day | ORAL | 1 refills | Status: DC

## 2018-08-05 MED ORDER — CHLORTHALIDONE 25 MG PO TABS: 25.0000 mg | ORAL_TABLET | Freq: Every day | ORAL | 1 refills | Status: DC

## 2018-08-05 NOTE — Progress Notes (Signed)
   Subjective:    Patient ID: Loretta Greene, female    DOB: November 04, 1970, 47 y.o.   MRN: [id number]  HPI HTN- chronic problem, on Amlodipine and chlorthalidone but pt has been out of medications for quite some time- which would explain BP elevation today.  Pt reports on medication BP is WNL.  No CP, SOB, HAs, visual changes, edema.  Obesity- pt is down 2 lbs since last visit.  BMI is 30.8.  Pt is walking regularly on breaks if weather permits.    Health Maintenance- due for mammo   Review of Systems For ROS see HPI     Objective:   Physical Exam  Constitutional: She is oriented to person, place, and time. She appears well-developed and well-nourished. No distress.  obese  HENT:  Head: Normocephalic and atraumatic.  Eyes: Pupils are equal, round, and reactive to light. Conjunctivae and EOM are normal.  Neck: Normal range of motion. Neck supple. No thyromegaly present.  Cardiovascular: Normal rate, regular rhythm, normal heart sounds and intact distal pulses.  No murmur heard. Pulmonary/Chest: Effort normal and breath sounds normal. No respiratory distress.  Abdominal: Soft. She exhibits no distension. There is no tenderness.  Musculoskeletal: She exhibits no edema.  Lymphadenopathy:    She has no cervical adenopathy.  Neurological: She is alert and oriented to person, place, and time.  Skin: Skin is warm and dry.  Psychiatric: She has a normal mood and affect. Her behavior is normal.  Vitals reviewed.         Assessment & Plan:

## 2018-08-05 NOTE — Assessment & Plan Note (Signed)
Ongoing issue for pt.  Stressed need for healthy diet and regular exercise.  Check labs to risk stratify.  Will follow 

## 2018-08-05 NOTE — Assessment & Plan Note (Signed)
Pt's BP is mildly elevated today but pt has been off medications for some time.  Restart BP meds.  Check labs.  Encouraged healthy diet and regular exercise.  Will continue to follow.

## 2018-08-05 NOTE — Patient Instructions (Signed)
Schedule your complete physical in 6 months We'll notify you of your lab results and make any changes if needed Continue to work on healthy diet and regular exercise- you can do it! Restart the BP meds daily Call with any questions or concerns Happy Holidays!

## 2018-09-22 ENCOUNTER — Ambulatory Visit: Payer: Self-pay

## 2018-09-23 ENCOUNTER — Ambulatory Visit
Admission: RE | Admit: 2018-09-23 | Discharge: 2018-09-23 | Disposition: A | Discharge status: Home / Self Care | Payer: BLUE CROSS/BLUE SHIELD | Source: Ambulatory Visit | Attending: Family Medicine | Admitting: Family Medicine

## 2018-09-23 DIAGNOSIS — Z1231 Encounter for screening mammogram for malignant neoplasm of breast: Secondary | ICD-10-CM | POA: Diagnosis not present

## 2019-01-04 ENCOUNTER — Other Ambulatory Visit: Payer: Self-pay

## 2019-01-04 ENCOUNTER — Encounter: Payer: Self-pay | Admitting: Family Medicine

## 2019-01-04 ENCOUNTER — Ambulatory Visit (INDEPENDENT_AMBULATORY_CARE_PROVIDER_SITE_OTHER): Payer: BLUE CROSS/BLUE SHIELD | Admitting: Family Medicine

## 2019-01-04 DIAGNOSIS — R350 Frequency of micturition: Secondary | ICD-10-CM | POA: Diagnosis not present

## 2019-01-04 NOTE — Progress Notes (Signed)
I have discussed the procedure for the virtual visit with the patient who has given consent to proceed with assessment and treatment.  ° °Kathryn A Nelson, CMA ° ° ° ° °

## 2019-01-04 NOTE — Progress Notes (Signed)
   Virtual Visit via Video   I connected with Sharen Heck on 01/04/19 at  3:00 PM EDT by a video enabled telemedicine application and verified that I am speaking with the correct person using two identifiers. Location patient: Home Location provider: [facility], Office Persons participating in the virtual visit: pt and myself  I discussed the limitations of evaluation and management by telemedicine and the availability of in person appointments. The patient expressed understanding and agreed to proceed.  Subjective:   HPI:  ? UTI- pt reports increased frequency, urgency, hesitancy, incomplete emptying.  Some dysuria.  + LBP.  No fevers.  No CVA tenderness.  + suprapubic pressure.  sxs started 'a couple of weeks ago'.  Increased her fluid intake in hopes of 'flushing it out'.  Cranberry juice helped somewhat but sxs persist.  ROS: See pertinent positives and negatives per HPI.  Patient Active Problem List   Diagnosis Date Noted  . Physical exam 01/16/2015  . Urinary incontinence in female 01/16/2015  . Obesity (BMI 30-39.9) 10/31/2014  . Palpitations 08/18/2013  . Hyperlipidemia 07/11/2013  . Anemia 07/11/2013  . GERD (gastroesophageal reflux disease) 11/22/2012  . HTN (hypertension) 12/19/2011    Social History   Tobacco Use  . Smoking status: Former Smoker    Last attempt to quit: 07/11/1990    Years since quitting: 28.5  . Smokeless tobacco: Never Used  Substance Use Topics  . Alcohol use: No    Current Outpatient Medications:  .  amLODipine (NORVASC) 5 MG tablet, Take 1 tablet (5 mg total) by mouth daily., Disp: 90 tablet, Rfl: 1 .  chlorthalidone (HYGROTON) 25 MG tablet, Take 1 tablet (25 mg total) by mouth daily., Disp: 90 tablet, Rfl: 1 .  omeprazole (PRILOSEC) 40 MG capsule, Take 1 capsule (40 mg total) by mouth daily. (Patient not taking: Reported on 07/17/2016), Disp: 30 capsule, Rfl: 3  Allergies  Allergen Reactions  . Diovan Hct  [Valsartan-Hydrochlorothiazide] Other (See Comments)    Tongue swells    Objective:   There were no vitals taken for this visit. AAOx3, NAD NCAT, EOMI No obvious CN deficits Coloring WNL Pt is able to speak clearly, coherently without shortness of breath or increased work of breathing.  Thought process is linear.  Mood is appropriate.   Assessment and Plan:    UTI- new.  Pt's sxs consistent w/ infxn.  Will treat empirically w/ Keflex but if no improvement will need UA and cx.  Pt expressed understanding and is in agreement w/ plan.    Neena Rhymes, MD 01/04/2019

## 2019-01-05 ENCOUNTER — Telehealth: Payer: Self-pay | Admitting: Family Medicine

## 2019-01-05 MED ORDER — CEPHALEXIN 500 MG PO CAPS: 500.0000 mg | ORAL_CAPSULE | Freq: Two times a day (BID) | ORAL | 0 refills | Status: AC

## 2019-01-05 NOTE — Telephone Encounter (Signed)
Copied from CRM [id number]. Topic: General - Other >> Jan 05, 2019 10:50 AM Gwenlyn Fudge A wrote: Reason for CRM: Pt called stating she was told a medication would be sent in to the pharmacy for a UTI. Pt states she called the pharmacy and they said they had not received a request to refill anything for pt. Pt is going into work at 11:48 and would like to have this resolved by then. If not pt states she will have to call back. Please advise.  [facility] [address] [place], [place] - [address] [place] [address] Phone: [phone] Fax: [phone] Not a 24 hour pharmacy; exact hours not known.

## 2019-01-05 NOTE — Telephone Encounter (Signed)
Prescription sent.  I apologize for the delay.

## 2019-01-05 NOTE — Telephone Encounter (Signed)
LMOVM informing patient medication has been called in.

## 2019-02-27 ENCOUNTER — Telehealth: Payer: Self-pay | Admitting: Family Medicine

## 2019-02-27 NOTE — Telephone Encounter (Signed)
I have LM asking pt to call back for scheduling.    Copied from CRM [id number]. Topic: General - Other >> Feb 24, 2019  4:25 PM Jaquita Rector A wrote: Reason for CRM: Patient called back to scheduling an appointment she was informed that she would be call on 02/27/2019 to schedule.

## 2019-03-24 ENCOUNTER — Ambulatory Visit (INDEPENDENT_AMBULATORY_CARE_PROVIDER_SITE_OTHER): Payer: BC Managed Care – PPO | Admitting: Family Medicine

## 2019-03-24 ENCOUNTER — Encounter: Payer: Self-pay | Admitting: Family Medicine

## 2019-03-24 ENCOUNTER — Other Ambulatory Visit: Payer: Self-pay

## 2019-03-24 VITALS — BP 121/81 | HR 77 | Temp 97.5°F | Resp 16 | Ht 61.0 in | Wt 168.5 lb

## 2019-03-24 DIAGNOSIS — E559 Vitamin D deficiency, unspecified: Secondary | ICD-10-CM

## 2019-03-24 DIAGNOSIS — I10 Essential (primary) hypertension: Secondary | ICD-10-CM

## 2019-03-24 DIAGNOSIS — E669 Obesity, unspecified: Secondary | ICD-10-CM | POA: Diagnosis not present

## 2019-03-24 DIAGNOSIS — E785 Hyperlipidemia, unspecified: Secondary | ICD-10-CM

## 2019-03-24 DIAGNOSIS — Z0000 Encounter for general adult medical examination without abnormal findings: Secondary | ICD-10-CM

## 2019-03-24 LAB — CBC WITH DIFFERENTIAL/PLATELET
Basophils Absolute: 0.1 10*3/uL (ref 0.0–0.1)
Basophils Relative: 1.3 % (ref 0.0–3.0)
Eosinophils Absolute: 0.1 10*3/uL (ref 0.0–0.7)
Eosinophils Relative: 2 % (ref 0.0–5.0)
HCT: 33.4 % — ABNORMAL LOW (ref 36.0–46.0)
Hemoglobin: 11.1 g/dL — ABNORMAL LOW (ref 12.0–15.0)
Lymphocytes Relative: 26.1 % (ref 12.0–46.0)
Lymphs Abs: 1.9 10*3/uL (ref 0.7–4.0)
MCHC: 33.2 g/dL (ref 30.0–36.0)
MCV: 86 fl (ref 78.0–100.0)
Monocytes Absolute: 0.6 10*3/uL (ref 0.1–1.0)
Monocytes Relative: 7.9 % (ref 3.0–12.0)
Neutro Abs: 4.5 10*3/uL (ref 1.4–7.7)
Neutrophils Relative %: 62.7 % (ref 43.0–77.0)
Platelets: 327 10*3/uL (ref 150.0–400.0)
RBC: 3.89 Mil/uL (ref 3.87–5.11)
RDW: 13 % (ref 11.5–15.5)
WBC: 7.1 10*3/uL (ref 4.0–10.5)

## 2019-03-24 LAB — LIPID PANEL
Cholesterol: 162 mg/dL (ref 0–200)
HDL: 40.6 mg/dL (ref >39.00)
LDL Cholesterol: 87 mg/dL (ref 0–99)
NonHDL: 121.62
Total CHOL/HDL Ratio: 4
Triglycerides: 173 mg/dL — ABNORMAL HIGH (ref 0.0–149.0)
VLDL: 34.6 mg/dL (ref 0.0–40.0)

## 2019-03-24 LAB — HEPATIC FUNCTION PANEL
ALT: 10 U/L (ref 0–35)
AST: 9 U/L (ref 0–37)
Albumin: 3.9 g/dL (ref 3.5–5.2)
Alkaline Phosphatase: 32 U/L — ABNORMAL LOW (ref 39–117)
Bilirubin, Direct: 0.1 mg/dL (ref 0.0–0.3)
Total Bilirubin: 0.4 mg/dL (ref 0.2–1.2)
Total Protein: 6.8 g/dL (ref 6.0–8.3)

## 2019-03-24 LAB — BASIC METABOLIC PANEL
BUN: 15 mg/dL (ref 6–23)
CO2: 30 mEq/L (ref 19–32)
Calcium: 8.9 mg/dL (ref 8.4–10.5)
Chloride: 102 mEq/L (ref 96–112)
Creatinine, Ser: 0.59 mg/dL (ref 0.40–1.20)
GFR: 130.9 mL/min (ref >60.00)
Glucose, Bld: 101 mg/dL — ABNORMAL HIGH (ref 70–99)
Potassium: 4.1 mEq/L (ref 3.5–5.1)
Sodium: 138 mEq/L (ref 135–145)

## 2019-03-24 LAB — TSH: TSH: 1.85 u[IU]/mL (ref 0.35–4.50)

## 2019-03-24 LAB — VITAMIN D 25 HYDROXY (VIT D DEFICIENCY, FRACTURES): VITD: 14.53 ng/mL — ABNORMAL LOW (ref 30.00–100.00)

## 2019-03-24 NOTE — Patient Instructions (Signed)
Follow up in 6 months to recheck BP We'll notify you of your lab results and make any changes if needed Continue to work on healthy diet and regular exercise- you can do it! Call with any questions or concerns Stay Safe!!! 

## 2019-03-24 NOTE — Assessment & Plan Note (Signed)
Chronic problem.  Attempting to control w/ diet and exercise.  Check labs.  Adjust tx prn  

## 2019-03-24 NOTE — Assessment & Plan Note (Signed)
Chronic problem.  Adequate control.  Asymptomatic.  Check labs.  No anticipated med changes.  Will follow. 

## 2019-03-24 NOTE — Assessment & Plan Note (Signed)
Pt's PE WNL w/ exception of obesity.  UTD on mammogram, immunizations.  Check labs.  Anticipatory guidance provided.

## 2019-03-24 NOTE — Assessment & Plan Note (Signed)
Pt has gained 5 lbs since last visit but this is likely stress eating due to [place].  Encouraged healthy diet and regular exercise.  Will follow.

## 2019-03-24 NOTE — Progress Notes (Signed)
   Subjective:    Patient ID: Loretta Greene, female    DOB: 06/24/1971, 48 y.o.   MRN: [id number]  HPI CPE- UTD on mammo, no need for pap due to hysterectomy.  Too young for colonoscopy.   Review of Systems Patient reports no vision/ hearing changes, adenopathy,fever, weight change,  persistant/recurrent hoarseness , swallowing issues, chest pain, palpitations, edema, persistant/recurrent cough, hemoptysis, dyspnea (rest/exertional/paroxysmal nocturnal), gastrointestinal bleeding (melena, rectal bleeding), abdominal pain, significant heartburn, bowel changes, GU symptoms (dysuria, hematuria, incontinence), Gyn symptoms (abnormal  bleeding, pain),  syncope, focal weakness, memory loss, numbness & tingling, skin/hair/nail changes, abnormal bruising or bleeding, anxiety, or depression.     Objective:   Physical Exam General Appearance:    Alert, cooperative, no distress, appears stated age, obese  Head:    Normocephalic, without obvious abnormality, atraumatic  Eyes:    PERRL, conjunctiva/corneas clear, EOM's intact, fundi    benign, both eyes  Ears:    Normal TM's and external ear canals, both ears  Nose:   Nares normal, septum midline, mucosa normal, no drainage    or sinus tenderness  Throat:   Lips, mucosa, and tongue normal; teeth and gums normal  Neck:   Supple, symmetrical, trachea midline, no adenopathy;    Thyroid: no enlargement/tenderness/nodules  Back:     Symmetric, no curvature, ROM normal, no CVA tenderness  Lungs:     Clear to auscultation bilaterally, respirations unlabored  Chest Wall:    No tenderness or deformity   Heart:    Regular rate and rhythm, S1 and S2 normal, no murmur, rub   or gallop  Breast Exam:    Deferred to mammo  Abdomen:     Soft, non-tender, bowel sounds active all four quadrants,    no masses, no organomegaly  Genitalia:    Deferred to GYN  Rectal:    Extremities:   Extremities normal, atraumatic, no cyanosis or edema  Pulses:   2+ and symmetric all  extremities  Skin:   Skin color, texture, turgor normal, no rashes or lesions  Lymph nodes:   Cervical, supraclavicular, and axillary nodes normal  Neurologic:   CNII-XII intact, normal strength, sensation and reflexes    throughout          Assessment & Plan:

## 2019-03-27 ENCOUNTER — Other Ambulatory Visit: Payer: Self-pay | Admitting: General Practice

## 2019-03-27 MED ORDER — VITAMIN D (ERGOCALCIFEROL) 1.25 MG (50000 UNIT) PO CAPS: 50000.0000 [IU] | ORAL_CAPSULE | ORAL | 0 refills | Status: DC

## 2019-06-16 ENCOUNTER — Encounter: Payer: Self-pay | Admitting: Family Medicine

## 2019-06-16 ENCOUNTER — Other Ambulatory Visit: Payer: Self-pay

## 2019-06-16 ENCOUNTER — Ambulatory Visit (INDEPENDENT_AMBULATORY_CARE_PROVIDER_SITE_OTHER): Payer: BC Managed Care – PPO | Admitting: Family Medicine

## 2019-06-16 DIAGNOSIS — M5416 Radiculopathy, lumbar region: Secondary | ICD-10-CM | POA: Diagnosis not present

## 2019-06-16 MED ORDER — PREDNISONE 10 MG PO TABS: ORAL_TABLET | ORAL | 0 refills | Status: DC

## 2019-06-16 NOTE — Progress Notes (Signed)
I have discussed the procedure for the virtual visit with the patient who has given consent to proceed with assessment and treatment.   states she was told she has a bulging disk. 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   Pt unable to obtain vitals  Davis Gourd, CMA
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
## 2019-06-19 ENCOUNTER — Telehealth: Payer: Self-pay | Admitting: Family Medicine

## 2019-06-19 NOTE — Telephone Encounter (Signed)
Pt called in asking if the prednison will weaken her immune system. If so what should she do about this?

## 2019-06-19 NOTE — Telephone Encounter (Signed)
Please advise 

## 2019-06-19 NOTE — Telephone Encounter (Signed)
Called pt and LMOVM to inform of PCP recommendations.  

## 2019-06-19 NOTE — Telephone Encounter (Signed)
The low dose and short (9 day) taper will not impact her immune system to the point of concern.  This is just to improve her back pain and should not cause other issues

## 2019-06-29 ENCOUNTER — Telehealth: Payer: Self-pay | Admitting: Family Medicine

## 2019-06-29 NOTE — Telephone Encounter (Signed)
Called and left a detailed message on voicemail to inform.

## 2019-06-29 NOTE — Telephone Encounter (Signed)
Is she feeling better?  If so, she can just stop.  If not, she can take 3 pills at the same time until meds are done

## 2019-06-29 NOTE — Telephone Encounter (Signed)
Pt called stating she just realized she was taking prednisone incorrectly. She has been taking only 1 pill per day. She has 13 pills left in the bottle. Pt asking how to proceed.   If medication needs sent in please use: [place], [place]

## 2019-06-29 NOTE — Telephone Encounter (Signed)
Please advise 

## 2019-06-30 NOTE — Telephone Encounter (Signed)
Called and left a second message to advise today.

## 2019-07-13 ENCOUNTER — Telehealth: Payer: Self-pay | Admitting: Family Medicine

## 2019-07-13 NOTE — Telephone Encounter (Signed)
The patient is having back pain and it's been about 3 weeks since she was taking Prednisone and there is still a dull pain and was wanting to know if you can call her in something or does she need to make an appointment.  Please Advise

## 2019-07-13 NOTE — Telephone Encounter (Signed)
Please advise 

## 2019-07-13 NOTE — Telephone Encounter (Signed)
Called and advised pt, she is going to check with insurance to see if they have a provider they prefer.

## 2019-07-13 NOTE — Telephone Encounter (Signed)
We would need to either send her to Sports Med for evaluation or have an in-office visit to assess

## 2019-08-01 DIAGNOSIS — M461 Sacroiliitis, not elsewhere classified: Secondary | ICD-10-CM | POA: Diagnosis not present

## 2019-08-01 DIAGNOSIS — M542 Cervicalgia: Secondary | ICD-10-CM | POA: Diagnosis not present

## 2019-08-01 DIAGNOSIS — M545 Low back pain: Secondary | ICD-10-CM | POA: Diagnosis not present

## 2019-09-17 ENCOUNTER — Other Ambulatory Visit: Payer: Self-pay | Admitting: Family Medicine

## 2019-09-19 ENCOUNTER — Telehealth: Payer: Self-pay | Admitting: Family Medicine

## 2019-09-19 ENCOUNTER — Encounter: Payer: Self-pay | Admitting: Family Medicine

## 2019-09-19 ENCOUNTER — Other Ambulatory Visit: Payer: Self-pay

## 2019-09-19 ENCOUNTER — Ambulatory Visit (INDEPENDENT_AMBULATORY_CARE_PROVIDER_SITE_OTHER): Payer: BC Managed Care – PPO | Admitting: Family Medicine

## 2019-09-19 VITALS — BP 153/85

## 2019-09-19 DIAGNOSIS — I10 Essential (primary) hypertension: Secondary | ICD-10-CM

## 2019-09-19 MED ORDER — CHLORTHALIDONE 25 MG PO TABS: 25.0000 mg | ORAL_TABLET | Freq: Every day | ORAL | 1 refills | Status: AC

## 2019-09-19 NOTE — Telephone Encounter (Signed)
Medication filled to pharmacy as requested.   

## 2019-09-19 NOTE — Telephone Encounter (Signed)
Pt called in asking for a refill on the chlorthalidone

## 2019-09-19 NOTE — Progress Notes (Signed)
   Virtual Visit via Video   I connected with patient on 09/19/19 at 10:00 AM EST by a video enabled telemedicine application and verified that I am speaking with the correct person using two identifiers.  Location patient: Home Location provider: Acupuncturist, Office Persons participating in the virtual visit: Patient, Provider, [place] (Jess B)  I discussed the limitations of evaluation and management by telemedicine and the availability of in person appointments. The patient expressed understanding and agreed to proceed.  Subjective:   HPI:   HTN- chronic problem.  On Amlodipine 5mg  and Chlorthalidone 25mg  daily.  Pt reports taking these but according to pharmacy records, she has only had 60 tabs of Amlodipine in the last 5 months and 30 pills of Chlorthalidone in the last 5 months.  At times she will back off the Chlorthalidone due to leg cramping.  BP was elevated last week at 153/85.  Pt reports she has been under more stress than usual for the last few months.  'I know when my pressure is up'.  No CP, SOB, HAs, visual changes, edema.  Pt is walking 2-3x/week.  Reports ~20 lb weight loss.  ROS:   See pertinent positives and negatives per HPI.  Patient Active Problem List   Diagnosis Date Noted  . Physical exam 01/16/2015  . Urinary incontinence in female 01/16/2015  . Obesity (BMI 30-39.9) 10/31/2014  . Palpitations 08/18/2013  . Hyperlipidemia 07/11/2013  . Anemia 07/11/2013  . GERD (gastroesophageal reflux disease) 11/22/2012  . HTN (hypertension) 12/19/2011    Social History   Tobacco Use  . Smoking status: Former Smoker    Quit date: 07/11/1990    Years since quitting: 29.2  . Smokeless tobacco: Never Used  Substance Use Topics  . Alcohol use: No    Current Outpatient Medications:  .  amLODipine (NORVASC) 5 MG tablet, Take 1 tablet by mouth once daily, Disp: 30 tablet, Rfl: 0 .  chlorthalidone (HYGROTON) 25 MG tablet, Take 1 tablet (25 mg total) by mouth  daily., Disp: 90 tablet, Rfl: 1 .  meloxicam (MOBIC) 15 MG tablet, Take 15 mg by mouth daily., Disp: , Rfl:  .  omeprazole (PRILOSEC) 40 MG capsule, Take 1 capsule (40 mg total) by mouth daily., Disp: 30 capsule, Rfl: 3 .  tiZANidine (ZANAFLEX) 2 MG tablet, Take by mouth every 6 (six) hours as needed for muscle spasms., Disp: , Rfl:   Allergies  Allergen Reactions  . Diovan Hct [Valsartan-Hydrochlorothiazide] Other (See Comments)    Tongue swells    Objective:   BP (!) 153/85   AAOx3, NAD NCAT, EOMI No obvious CN deficits Coloring WNL Pt is able to speak clearly, coherently without shortness of breath or increased work of breathing.  Thought process is linear.  Mood is appropriate.    Assessment and Plan:   HTN- chronic problem.  Elevated last week when she checked it but she reports increased stress recently.  Also admits to not taking BP meds regularly.  Encouraged her to take them regularly so we can determine if a potassium supplement is needed.  Applauded her efforts at weight loss.  Will continue to follow.   Annye Asa, MD 09/19/2019

## 2019-09-21 ENCOUNTER — Ambulatory Visit (INDEPENDENT_AMBULATORY_CARE_PROVIDER_SITE_OTHER): Payer: BC Managed Care – PPO

## 2019-09-21 ENCOUNTER — Other Ambulatory Visit: Payer: Self-pay

## 2019-09-21 DIAGNOSIS — I10 Essential (primary) hypertension: Secondary | ICD-10-CM | POA: Diagnosis not present

## 2019-09-21 DIAGNOSIS — Z23 Encounter for immunization: Secondary | ICD-10-CM | POA: Diagnosis not present

## 2019-09-21 NOTE — Progress Notes (Signed)
Loretta Greene 48 y.o female presents to office today for nurse visit. Administered BOOSTRIX 0.5 mL IM left arm per Annye Asa, MD. Patient tolerated well. Performed routine BP check, 150/86. Patient states she had not taken her morning BP medication.

## 2019-09-21 NOTE — Addendum Note (Signed)
Addended by: Doran Clay A on: 09/21/2019 08:23 AM   Modules accepted: Orders

## 2019-09-22 LAB — LIPID PANEL
Cholesterol: 183 mg/dL (ref <200)
HDL: 36 mg/dL — ABNORMAL LOW (ref >=50)
LDL Cholesterol (Calc): 118 mg/dL (calc) — ABNORMAL HIGH
Non-HDL Cholesterol (Calc): 147 mg/dL (calc) — ABNORMAL HIGH (ref <130)
Total CHOL/HDL Ratio: 5.1 (calc) — ABNORMAL HIGH (ref <5.0)
Triglycerides: 173 mg/dL — ABNORMAL HIGH (ref <150)

## 2019-09-22 LAB — BASIC METABOLIC PANEL
BUN: 14 mg/dL (ref 7–25)
CO2: 28 mmol/L (ref 20–32)
Calcium: 9.4 mg/dL (ref 8.6–10.2)
Chloride: 101 mmol/L (ref 98–110)
Creat: 0.69 mg/dL (ref 0.50–1.10)
Glucose, Bld: 116 mg/dL — ABNORMAL HIGH (ref 65–99)
Potassium: 3.8 mmol/L (ref 3.5–5.3)
Sodium: 138 mmol/L (ref 135–146)

## 2019-09-22 LAB — CBC WITH DIFFERENTIAL/PLATELET
Absolute Monocytes: 600 cells/uL (ref 200–950)
Basophils Absolute: 79 cells/uL (ref 0–200)
Basophils Relative: 1 %
Eosinophils Absolute: 158 cells/uL (ref 15–500)
Eosinophils Relative: 2 %
HCT: 34.3 % — ABNORMAL LOW (ref 35.0–45.0)
Hemoglobin: 11.3 g/dL — ABNORMAL LOW (ref 11.7–15.5)
Lymphs Abs: 2481 cells/uL (ref 850–3900)
MCH: 27.7 pg (ref 27.0–33.0)
MCHC: 32.9 g/dL (ref 32.0–36.0)
MCV: 84.1 fL (ref 80.0–100.0)
MPV: 10 fL (ref 7.5–12.5)
Monocytes Relative: 7.6 %
Neutro Abs: 4582 cells/uL (ref 1500–7800)
Neutrophils Relative %: 58 %
Platelets: 380 10*3/uL (ref 140–400)
RBC: 4.08 10*6/uL (ref 3.80–5.10)
RDW: 12.7 % (ref 11.0–15.0)
Total Lymphocyte: 31.4 %
WBC: 7.9 10*3/uL (ref 3.8–10.8)

## 2019-09-22 LAB — HEPATIC FUNCTION PANEL
AG Ratio: 1.4 (calc) (ref 1.0–2.5)
ALT: 17 U/L (ref 6–29)
AST: 13 U/L (ref 10–35)
Albumin: 3.9 g/dL (ref 3.6–5.1)
Alkaline phosphatase (APISO): 37 U/L (ref 31–125)
Bilirubin, Direct: 0.1 mg/dL (ref 0.0–0.2)
Globulin: 2.8 g/dL (calc) (ref 1.9–3.7)
Indirect Bilirubin: 0.5 mg/dL (calc) (ref 0.2–1.2)
Total Bilirubin: 0.6 mg/dL (ref 0.2–1.2)
Total Protein: 6.7 g/dL (ref 6.1–8.1)

## 2019-09-22 LAB — TSH: TSH: 2.03 mIU/L

## 2019-09-27 ENCOUNTER — Ambulatory Visit: Payer: BC Managed Care – PPO | Admitting: Family Medicine

## 2019-09-28 ENCOUNTER — Ambulatory Visit: Payer: BC Managed Care – PPO | Admitting: Family Medicine

## 2019-10-24 ENCOUNTER — Other Ambulatory Visit: Payer: Self-pay | Admitting: Family Medicine

## 2019-10-24 NOTE — Telephone Encounter (Signed)
Pt needs refill of amlodipine , only has 3 pills left. [facility] on [address]

## 2020-01-25 DIAGNOSIS — Z0001 Encounter for general adult medical examination with abnormal findings: Secondary | ICD-10-CM | POA: Diagnosis not present

## 2020-01-29 DIAGNOSIS — Z124 Encounter for screening for malignant neoplasm of cervix: Secondary | ICD-10-CM | POA: Diagnosis not present

## 2020-01-29 DIAGNOSIS — Z01419 Encounter for gynecological examination (general) (routine) without abnormal findings: Secondary | ICD-10-CM | POA: Diagnosis not present

## 2020-02-08 DIAGNOSIS — Z1231 Encounter for screening mammogram for malignant neoplasm of breast: Secondary | ICD-10-CM | POA: Diagnosis not present

## 2020-02-22 DIAGNOSIS — M9901 Segmental and somatic dysfunction of cervical region: Secondary | ICD-10-CM | POA: Diagnosis not present

## 2020-02-22 DIAGNOSIS — M9903 Segmental and somatic dysfunction of lumbar region: Secondary | ICD-10-CM | POA: Diagnosis not present

## 2020-02-22 DIAGNOSIS — M47816 Spondylosis without myelopathy or radiculopathy, lumbar region: Secondary | ICD-10-CM | POA: Diagnosis not present

## 2020-02-22 DIAGNOSIS — M5137 Other intervertebral disc degeneration, lumbosacral region: Secondary | ICD-10-CM | POA: Diagnosis not present

## 2020-03-18 ENCOUNTER — Encounter: Payer: Self-pay | Admitting: Gastroenterology

## 2020-04-22 ENCOUNTER — Encounter: Payer: BC Managed Care – PPO | Admitting: Gastroenterology

## 2020-04-29 ENCOUNTER — Encounter: Payer: BC Managed Care – PPO | Admitting: Gastroenterology

## 2020-04-29 DIAGNOSIS — S86911A Strain of unspecified muscle(s) and tendon(s) at lower leg level, right leg, initial encounter: Secondary | ICD-10-CM | POA: Diagnosis not present

## 2020-05-07 ENCOUNTER — Other Ambulatory Visit: Payer: Self-pay

## 2020-05-07 ENCOUNTER — Encounter (HOSPITAL_COMMUNITY): Payer: Self-pay | Admitting: Student

## 2020-05-07 ENCOUNTER — Emergency Department (HOSPITAL_COMMUNITY)
Admission: EM | Admit: 2020-05-07 | Discharge: 2020-05-07 | Disposition: A | Discharge status: Home / Self Care | Payer: BC Managed Care – PPO | Attending: Emergency Medicine | Admitting: Emergency Medicine

## 2020-05-07 DIAGNOSIS — Z79899 Other long term (current) drug therapy: Secondary | ICD-10-CM | POA: Diagnosis not present

## 2020-05-07 DIAGNOSIS — M25461 Effusion, right knee: Secondary | ICD-10-CM

## 2020-05-07 DIAGNOSIS — I10 Essential (primary) hypertension: Secondary | ICD-10-CM | POA: Insufficient documentation

## 2020-05-07 DIAGNOSIS — Z87891 Personal history of nicotine dependence: Secondary | ICD-10-CM | POA: Insufficient documentation

## 2020-05-07 MED ORDER — METHYLPREDNISOLONE SODIUM SUCC 40 MG IJ SOLR
40.0000 mg | Freq: Once | INTRAMUSCULAR | Status: AC
  Administered 2020-05-07: 40 mg via INTRAVENOUS
  Filled 2020-05-07: qty 1

## 2020-05-07 MED ORDER — LIDOCAINE HCL (PF) 1 % IJ SOLN
30.0000 mL | Freq: Once | INTRAMUSCULAR | Status: AC
  Administered 2020-05-07: 30 mL
  Filled 2020-05-07: qty 30

## 2020-05-07 NOTE — Discharge Instructions (Addendum)
Contact a health care provider if you: Continue to have pain in your knee. Get help right away if you: Have swelling or redness of your knee that gets worse or does not get better. Have severe pain in your knee. Have a fever. 

## 2020-05-07 NOTE — ED Triage Notes (Signed)
Patient BIB POV c/o pain in her right knee that has been going on for about 2 months after hurting it at the gym.  Patient stating that now her left knee is starting to hurt due to putting more weight on it.  Pain is 6/10.

## 2020-05-07 NOTE — ED Provider Notes (Signed)
[facility]-EMERGENCY DEPT Provider Note   CSN: [id number] Arrival date & time: 05/07/20  1003     History Chief Complaint  Patient presents with  . Knee Pain    Loretta Greene is a 49 y.o. female.  Who presents emergency department chief complaint of right knee pain.  Patient states that she was doing the elliptical machine 3 weeks ago and felt pain on the medial side of her knee.  Since that time her knee pain has gotten progressively worse.  She has sensations of clicking and instability in the medial side of the knee.  She is notices been very swollen.  She has been using ibuprofen and Aleve along with Tylenol and ice without any significant improvement.  She states that is very difficult for her to get up and down stairs.  She is never had any problems with her knees in the past.  HPI     Past Medical History:  Diagnosis Date  . GERD (gastroesophageal reflux disease)   . HTN (hypertension) 12/19/2011  . Hypertension     Patient Active Problem List   Diagnosis Date Noted  . Physical exam 01/16/2015  . Urinary incontinence in female 01/16/2015  . Obesity (BMI 30-39.9) 10/31/2014  . Palpitations 08/18/2013  . Hyperlipidemia 07/11/2013  . Anemia 07/11/2013  . GERD (gastroesophageal reflux disease) 11/22/2012  . HTN (hypertension) 12/19/2011    Past Surgical History:  Procedure Laterality Date  . ABDOMINAL HYSTERECTOMY  05/14/2006   partial, still has ovaries     OB History   No obstetric history on file.     Family History  Problem Relation Age of Onset  . Diabetes Mother   . Hypertension Mother   . Heart disease Mother   . Dementia Mother   . Hypertension Father   . Kidney disease Father   . Heart disease Father   . Diabetes Father     Social History   Tobacco Use  . Smoking status: Former Smoker    Quit date: 07/11/1990    Years since quitting: 29.8  . Smokeless tobacco: Never Used  Substance Use Topics  . Alcohol use: No  . Drug  use: No    Home Medications Prior to Admission medications   Medication Sig Start Date End Date Taking? Authorizing Provider  amLODipine (NORVASC) 5 MG tablet Take 1 tablet by mouth once daily 10/24/19   Sheliah Hatch, MD  chlorthalidone (HYGROTON) 25 MG tablet Take 1 tablet (25 mg total) by mouth daily. 09/19/19   Sheliah Hatch, MD  meloxicam (MOBIC) 15 MG tablet Take 15 mg by mouth daily.    [provider]  omeprazole (PRILOSEC) 40 MG capsule Take 1 capsule (40 mg total) by mouth daily. 04/05/15   Sheliah Hatch, MD  tiZANidine (ZANAFLEX) 2 MG tablet Take by mouth every 6 (six) hours as needed for muscle spasms.    [provider]    Allergies    Diovan hct [valsartan-hydrochlorothiazide]  Review of Systems   Review of Systems Ten systems reviewed and are negative for acute change, except as noted in the HPI.   Physical Exam Updated Vital Signs BP (!) 170/88 (BP Location: Left Arm)   Pulse 71   Temp 98.6 F (37 C) (Oral)   Resp 16   Ht 5\' 1"  (1.549 m)   Wt 74.8 kg   SpO2 100%   BMI 31.18 kg/m   Physical Exam Vitals and nursing note reviewed.  Constitutional:  General: She is not in acute distress.    Appearance: She is well-developed. She is not diaphoretic.  HENT:     Head: Normocephalic and atraumatic.  Eyes:     General: No scleral icterus.    Conjunctiva/sclera: Conjunctivae normal.  Cardiovascular:     Rate and Rhythm: Normal rate and regular rhythm.     Heart sounds: Normal heart sounds. No murmur heard.  No friction rub. No gallop.   Pulmonary:     Effort: Pulmonary effort is normal. No respiratory distress.     Breath sounds: Normal breath sounds.  Abdominal:     General: Bowel sounds are normal. There is no distension.     Palpations: Abdomen is soft. There is no mass.     Tenderness: There is no abdominal tenderness. There is no guarding.  Musculoskeletal:     Cervical back: Normal range of motion.     Right  knee: Effusion and crepitus present. No tenderness. No LCL laxity, MCL laxity, ACL laxity or PCL laxity. Normal alignment.     Instability Tests: Anterior drawer test negative. Posterior drawer test negative.     Left knee: Normal.  Skin:    General: Skin is warm and dry.  Neurological:     Mental Status: She is alert and oriented to person, place, and time.  Psychiatric:        Behavior: Behavior normal.     ED Results / Procedures / Treatments   Labs (all labs ordered are listed, but only abnormal results are displayed) Labs Reviewed - No data to display  EKG None  Radiology No results found.  Procedures .Joint Aspiration/Arthrocentesis  Date/Time: 05/07/2020 2:51 PM Performed by: Arthor Captain, PA-C Authorized by: Arthor Captain, PA-C   Consent:    Consent obtained:  Verbal   Consent given by:  Patient   Risks discussed:  Bleeding, incomplete drainage, pain and infection   Alternatives discussed:  No treatment Universal protocol:    Procedure explained and questions answered to patient or proxy's satisfaction: yes     Site/side marked: yes     Patient identity confirmed:  Verbally with patient and provided demographic data Location:    Location:  Knee   Knee:  L knee Anesthesia (see MAR for exact dosages):    Anesthesia method:  Local infiltration   Local anesthetic:  Lidocaine 1% w/o epi Procedure details:    Preparation: Patient was prepped and draped in usual sterile fashion     Needle gauge:  18 G   Approach:  Anterior   Aspirate amount:  36 ml   Aspirate characteristics:  Blood-tinged and clear   Steroid injected: yes   Post-procedure details:    Dressing:  Adhesive bandage   Patient tolerance of procedure:  Tolerated well, no immediate complications   (including critical care time)  Medications Ordered in ED Medications  lidocaine (PF) (XYLOCAINE) 1 % injection 30 mL (has no administration in time range)  methylPREDNISolone sodium succinate  (SOLU-MEDROL) 40 mg/mL injection 40 mg (has no administration in time range)    ED Course  I have reviewed the triage vital signs and the nursing notes.  Pertinent labs & imaging results that were available during my care of the patient were reviewed by me and considered in my medical decision making (see chart for details).    MDM Rules/Calculators/A&P  49 year old female here with large right knee effusion.  I was able to pull off almost 40 cc of blood-tinged fluid from the knee.  I was then able to put in 40 mg of Solu-Medrol and lidocaine.  Patient had significant improvement in her pain and range of motion.  She states that her knee is feeling much better.  No evidence of infection.  Patient is advised to follow closely with orthopedics.  She has a knee sleeve at home and I have advised her to apply compression, ice at least three times a day.  Discussed return precautions.  Patient appears appropriate for discharge at this time Final Clinical Impression(s) / ED Diagnoses Final diagnoses:  None    Rx / DC Orders ED Discharge Orders    None       Arthor Captain, PA-C 05/07/20 1453    Geoffery Lyons, MD 05/08/20 [phone]

## 2020-05-13 DIAGNOSIS — M25561 Pain in right knee: Secondary | ICD-10-CM | POA: Diagnosis not present
